# Patient Record
Sex: Female | Born: 1995 | Race: White | Hispanic: No | Marital: Single | Smoking: Current every day smoker
Health system: Southern US, Community
[De-identification: ages and names within clinical notes are randomized; demographics above are authoritative.]

## PROBLEM LIST (undated history)

## (undated) DIAGNOSIS — K589 Irritable bowel syndrome without diarrhea: Secondary | ICD-10-CM

## (undated) DIAGNOSIS — E739 Lactose intolerance, unspecified: Secondary | ICD-10-CM

## (undated) DIAGNOSIS — N926 Irregular menstruation, unspecified: Secondary | ICD-10-CM

## (undated) DIAGNOSIS — F909 Attention-deficit hyperactivity disorder, unspecified type: Secondary | ICD-10-CM

## (undated) DIAGNOSIS — Z309 Encounter for contraceptive management, unspecified: Secondary | ICD-10-CM

## (undated) HISTORY — DX: Attention-deficit hyperactivity disorder, unspecified type: F90.9

## (undated) HISTORY — DX: Lactose intolerance, unspecified: E73.9

## (undated) HISTORY — DX: Encounter for contraceptive management, unspecified: Z30.9

## (undated) HISTORY — PX: NO PAST SURGERIES: SHX2092

## (undated) HISTORY — DX: Irregular menstruation, unspecified: N92.6

## (undated) HISTORY — DX: Irritable bowel syndrome, unspecified: K58.9

---
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
## 2015-12-06 ENCOUNTER — Encounter: Payer: Self-pay | Admitting: Adult Health

## 2015-12-06 ENCOUNTER — Ambulatory Visit (INDEPENDENT_AMBULATORY_CARE_PROVIDER_SITE_OTHER): Payer: BLUE CROSS/BLUE SHIELD | Admitting: Adult Health

## 2015-12-06 VITALS — BP 120/80 | HR 84 | Ht 60.25 in | Wt 154.0 lb

## 2015-12-06 DIAGNOSIS — Z30011 Encounter for initial prescription of contraceptive pills: Secondary | ICD-10-CM

## 2015-12-06 DIAGNOSIS — Z3202 Encounter for pregnancy test, result negative: Secondary | ICD-10-CM | POA: Diagnosis not present

## 2015-12-06 DIAGNOSIS — Z113 Encounter for screening for infections with a predominantly sexual mode of transmission: Secondary | ICD-10-CM | POA: Diagnosis not present

## 2015-12-06 DIAGNOSIS — N926 Irregular menstruation, unspecified: Secondary | ICD-10-CM

## 2015-12-06 LAB — POCT URINE PREGNANCY: PREG TEST UR: NEGATIVE

## 2015-12-06 MED ORDER — NORGESTIMATE-ETH ESTRADIOL 0.25-35 MG-MCG PO TABS: 1.0000 | ORAL_TABLET | Freq: Every day | ORAL | 11 refills | Status: DC

## 2015-12-06 NOTE — Patient Instructions (Addendum)
Start OCs Sunday Use condoms Follow up prn

## 2015-12-06 NOTE — Progress Notes (Signed)
Subjective:     Patient ID: Dawn Huff, female   DOB: Nov 10, 1995, 20 y.o.   MRN: [id number]  HPI [place] is a 20 year old white female in requesting STD screening and wants to get back on birth control, she is having irregular periods and has been off birth control pills over 2 months.   Review of Systems Patient denies any headaches, hearing loss, fatigue, blurred vision, shortness of breath, chest pain, abdominal pain, problems with bowel movements, urination, or intercourse. No joint pain or mood swings. +irregular periods    Reviewed past medical,surgical, social and family history. Reviewed medications and allergies.  Objective:   Physical Exam BP 120/80 (BP Location: Left Arm, Patient Position: Sitting, Cuff Size: Normal)   Pulse 84   Ht 5' 0.25" (1.53 m)   Wt 154 lb (69.9 kg)   LMP 11/05/2015 (Approximate)   BMI 29.83 kg/m UPT negative, Skin warm and dry.Pelvic: external genitalia is normal in appearance no lesions,vagina has period like blood present,urethra has no lesions or masses noted, cervix:smooth, uterus: normal size, shape and contour, non tender, no masses felt, adnexa: no masses or tenderness noted. Bladder is non tender and no masses felt.  GC/CHL obtained.     Assessment:     1. Encounter for initial prescription of contraceptive pills   2. Screening for STD (sexually transmitted disease)   3. Irregular periods   4. Pregnancy examination or test, negative result       Plan:    GC/CHL sent Check HIV,RPR and HSV 2 Rx sprintec, disp 1 pack take 1 daily with 11 refills  Use condoms  Follow up prn

## 2015-12-07 LAB — HSV 2 ANTIBODY, IGG: HSV 2 Glycoprotein G Ab, IgG: <0.91 index (ref 0.00–0.90)

## 2015-12-07 LAB — HIV ANTIBODY (ROUTINE TESTING W REFLEX): HIV Screen 4th Generation wRfx: NONREACTIVE

## 2015-12-07 LAB — RPR: RPR Ser Ql: NONREACTIVE

## 2015-12-10 LAB — GC/CHLAMYDIA PROBE AMP
Chlamydia trachomatis, NAA: NEGATIVE
Neisseria gonorrhoeae by PCR: NEGATIVE

## 2015-12-27 ENCOUNTER — Telehealth: Payer: Self-pay | Admitting: *Deleted

## 2015-12-27 NOTE — Telephone Encounter (Signed)
Pt states has unprotected sex a 4 days ago, c/o bumps on vagina. Pt given an appt with Cyril MourningJennifer Griffin, NP for Monday.

## 2015-12-30 ENCOUNTER — Encounter: Payer: Self-pay | Admitting: *Deleted

## 2015-12-30 ENCOUNTER — Ambulatory Visit: Payer: Self-pay | Admitting: Adult Health

## 2020-06-07 ENCOUNTER — Emergency Department (HOSPITAL_COMMUNITY)
Admission: EM | Admit: 2020-06-07 | Discharge: 2020-06-07 | Disposition: A | Discharge status: Home / Self Care | Payer: Medicaid Other | Attending: Emergency Medicine | Admitting: Emergency Medicine

## 2020-06-07 ENCOUNTER — Other Ambulatory Visit: Payer: Self-pay

## 2020-06-07 ENCOUNTER — Encounter (HOSPITAL_COMMUNITY): Payer: Self-pay | Admitting: *Deleted

## 2020-06-07 DIAGNOSIS — Z5321 Procedure and treatment not carried out due to patient leaving prior to being seen by health care provider: Secondary | ICD-10-CM | POA: Diagnosis not present

## 2020-06-07 DIAGNOSIS — Z7689 Persons encountering health services in other specified circumstances: Secondary | ICD-10-CM | POA: Diagnosis not present

## 2020-06-07 NOTE — ED Notes (Signed)
Not in triage area when called

## 2020-06-07 NOTE — ED Triage Notes (Signed)
Called , not in waiting area

## 2020-06-07 NOTE — ED Triage Notes (Signed)
States she needs proof of pregnancy to get her medicaid

## 2020-06-12 ENCOUNTER — Emergency Department (HOSPITAL_COMMUNITY)
Admission: EM | Admit: 2020-06-12 | Discharge: 2020-06-12 | Disposition: A | Discharge status: Home / Self Care | Payer: Medicaid Other | Attending: Emergency Medicine | Admitting: Emergency Medicine

## 2020-06-12 ENCOUNTER — Emergency Department (HOSPITAL_COMMUNITY): Payer: Medicaid Other

## 2020-06-12 ENCOUNTER — Other Ambulatory Visit: Payer: Self-pay

## 2020-06-12 ENCOUNTER — Encounter (HOSPITAL_COMMUNITY): Payer: Self-pay

## 2020-06-12 DIAGNOSIS — O26891 Other specified pregnancy related conditions, first trimester: Secondary | ICD-10-CM | POA: Insufficient documentation

## 2020-06-12 DIAGNOSIS — Z3A01 Less than 8 weeks gestation of pregnancy: Secondary | ICD-10-CM | POA: Diagnosis not present

## 2020-06-12 DIAGNOSIS — R102 Pelvic and perineal pain: Secondary | ICD-10-CM

## 2020-06-12 DIAGNOSIS — R55 Syncope and collapse: Secondary | ICD-10-CM | POA: Diagnosis not present

## 2020-06-12 DIAGNOSIS — R42 Dizziness and giddiness: Secondary | ICD-10-CM | POA: Diagnosis not present

## 2020-06-12 DIAGNOSIS — H538 Other visual disturbances: Secondary | ICD-10-CM | POA: Diagnosis not present

## 2020-06-12 DIAGNOSIS — O26811 Pregnancy related exhaustion and fatigue, first trimester: Secondary | ICD-10-CM | POA: Diagnosis not present

## 2020-06-12 DIAGNOSIS — F17210 Nicotine dependence, cigarettes, uncomplicated: Secondary | ICD-10-CM | POA: Insufficient documentation

## 2020-06-12 LAB — CBC WITH DIFFERENTIAL/PLATELET
Abs Immature Granulocytes: 0.05 10*3/uL (ref 0.00–0.07)
Basophils Absolute: 0.1 10*3/uL (ref 0.0–0.1)
Basophils Relative: 1 %
Eosinophils Absolute: 0.3 10*3/uL (ref 0.0–0.5)
Eosinophils Relative: 2 %
HCT: 40.6 % (ref 36.0–46.0)
Hemoglobin: 13.5 g/dL (ref 12.0–15.0)
Immature Granulocytes: 1 %
Lymphocytes Relative: 29 %
Lymphs Abs: 3.2 10*3/uL (ref 0.7–4.0)
MCH: 31.4 pg (ref 26.0–34.0)
MCHC: 33.3 g/dL (ref 30.0–36.0)
MCV: 94.4 fL (ref 80.0–100.0)
Monocytes Absolute: 0.6 10*3/uL (ref 0.1–1.0)
Monocytes Relative: 6 %
Neutro Abs: 6.9 10*3/uL (ref 1.7–7.7)
Neutrophils Relative %: 61 %
Platelets: 336 10*3/uL (ref 150–400)
RBC: 4.3 MIL/uL (ref 3.87–5.11)
RDW: 13 % (ref 11.5–15.5)
WBC: 11.1 10*3/uL — ABNORMAL HIGH (ref 4.0–10.5)
nRBC: 0 % (ref 0.0–0.2)

## 2020-06-12 LAB — BASIC METABOLIC PANEL
Anion gap: 7 (ref 5–15)
BUN: 17 mg/dL (ref 6–20)
CO2: 25 mmol/L (ref 22–32)
Calcium: 8.6 mg/dL — ABNORMAL LOW (ref 8.9–10.3)
Chloride: 105 mmol/L (ref 98–111)
Creatinine, Ser: 0.6 mg/dL (ref 0.44–1.00)
GFR, Estimated: >60 mL/min (ref >60)
Glucose, Bld: 106 mg/dL — ABNORMAL HIGH (ref 70–99)
Potassium: 3.4 mmol/L — ABNORMAL LOW (ref 3.5–5.1)
Sodium: 137 mmol/L (ref 135–145)

## 2020-06-12 LAB — URINALYSIS, ROUTINE W REFLEX MICROSCOPIC
Bacteria, UA: NONE SEEN
Bilirubin Urine: NEGATIVE
Glucose, UA: NEGATIVE mg/dL
Hgb urine dipstick: NEGATIVE
Ketones, ur: 5 mg/dL — AB
Nitrite: NEGATIVE
Protein, ur: 100 mg/dL — AB
Specific Gravity, Urine: 1.026 (ref 1.005–1.030)
pH: 5 (ref 5.0–8.0)

## 2020-06-12 LAB — HCG, QUANTITATIVE, PREGNANCY: hCG, Beta Chain, Quant, S: 8827 m[IU]/mL — ABNORMAL HIGH (ref <5)

## 2020-06-12 IMAGING — US US OB < 14 WEEKS - US OB TV
1 series · 14 of 28 positions shown · non-contrast
Comparison: None.

CLINICAL DATA: Left lower quadrant abdominal pain, first trimester
of pregnancy.

EXAM:
OBSTETRIC <14 WK US AND TRANSVAGINAL OB US
TECHNIQUE: Both transabdominal and transvaginal ultrasound examinations were
performed for complete evaluation of the gestation as well as the
maternal uterus, adnexal regions, and pelvic cul-de-sac.
Transvaginal technique was performed to assess early pregnancy.

[Series 1: us ob < 14 weeks - us ob tv · 0.20mm/px · 54 acquisitions, 14 frames shown]
[im 2/54]
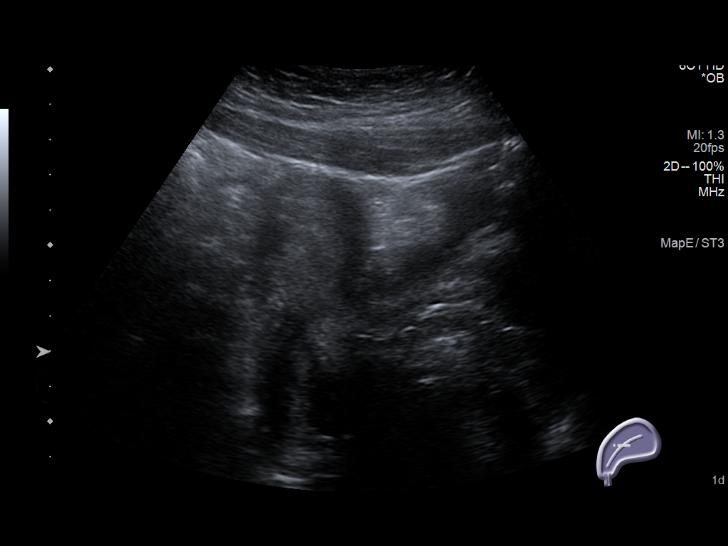
[im 6/54]
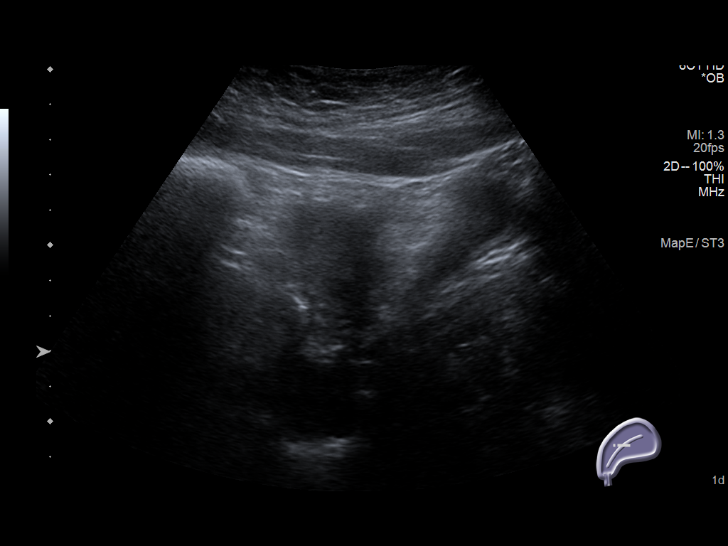
[im 10/54]
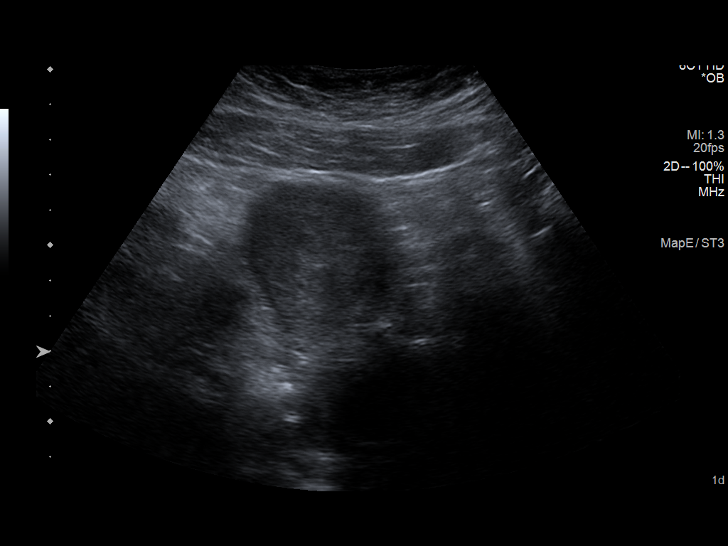
[im 14/54]
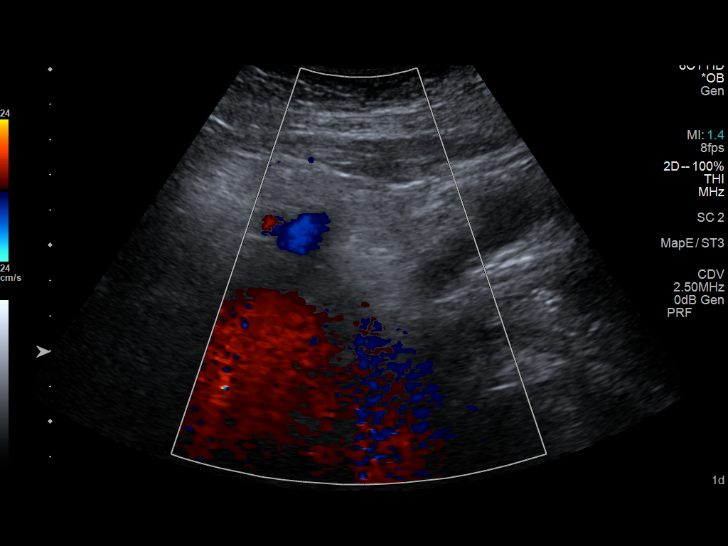
[im 18/54]
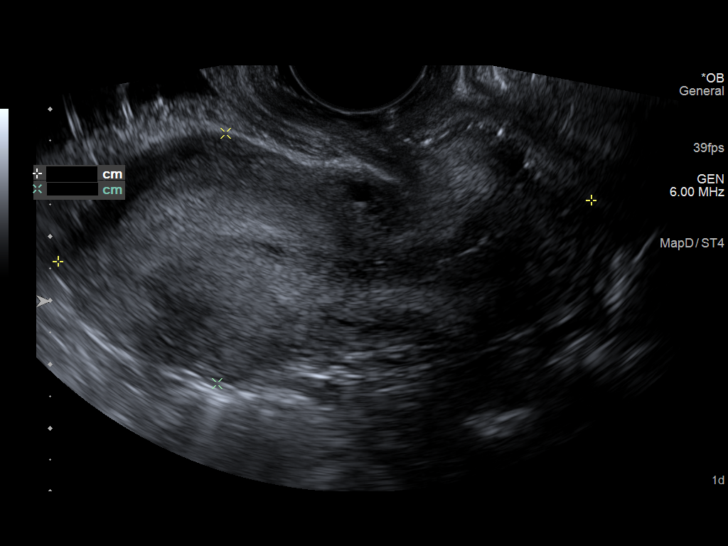
[im 22/54]
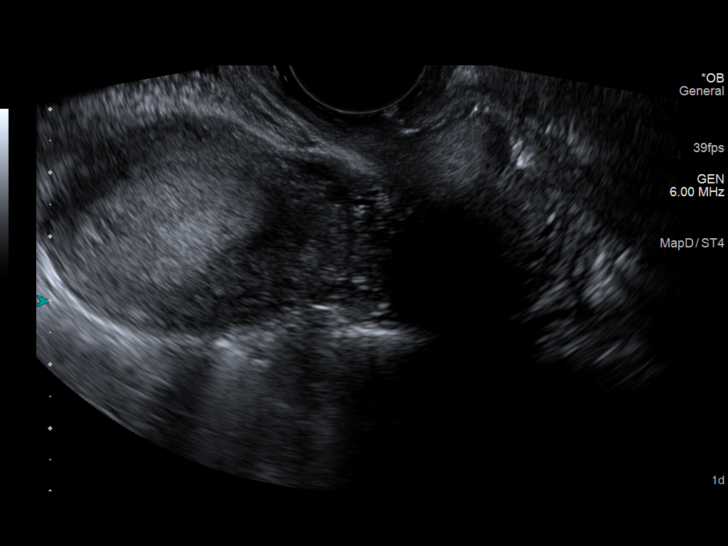
[im 26/54]
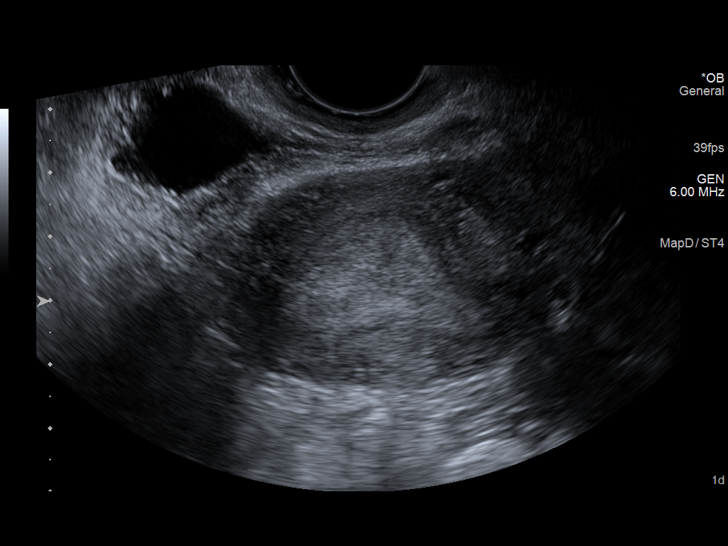
[im 30/54]
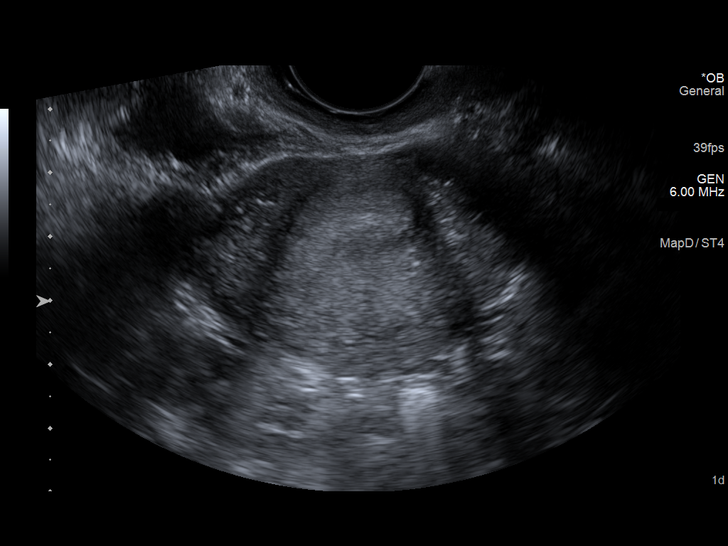
[im 34/54]
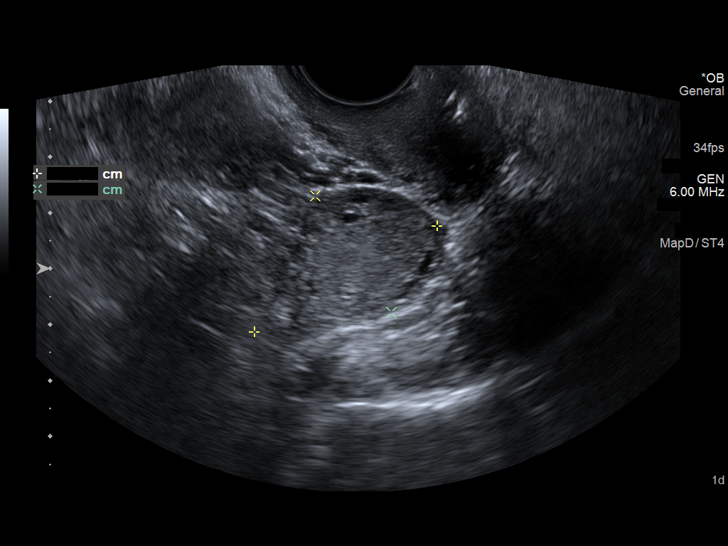
[im 38/54]
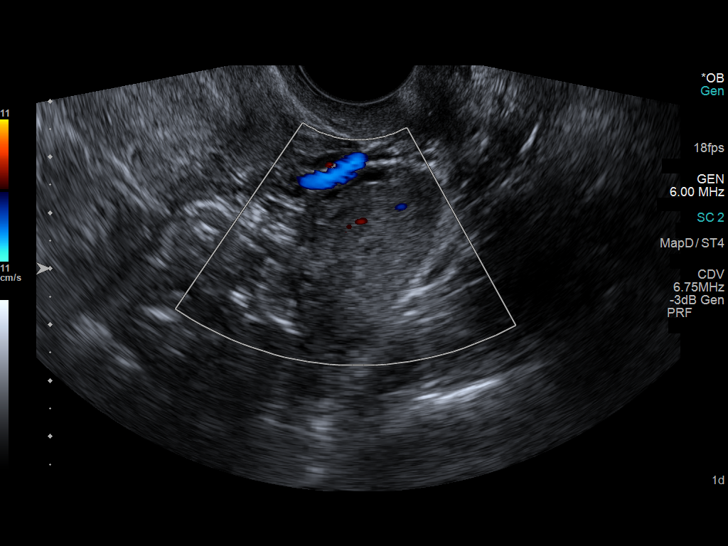
[im 42/54]
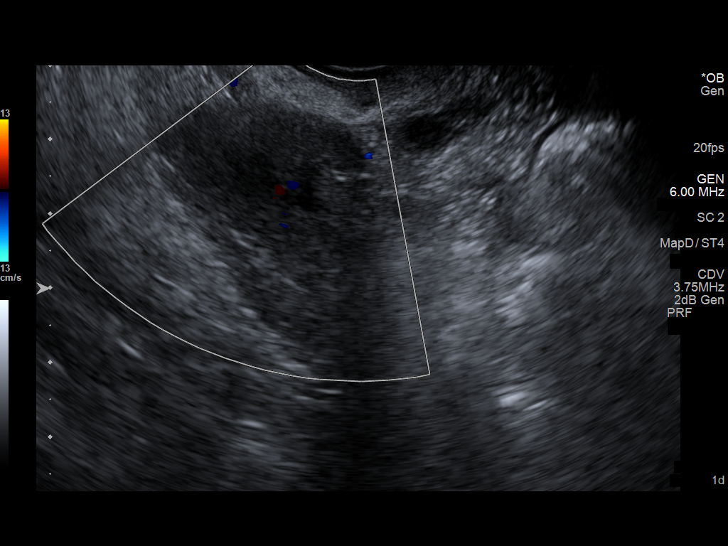
[im 46/54]
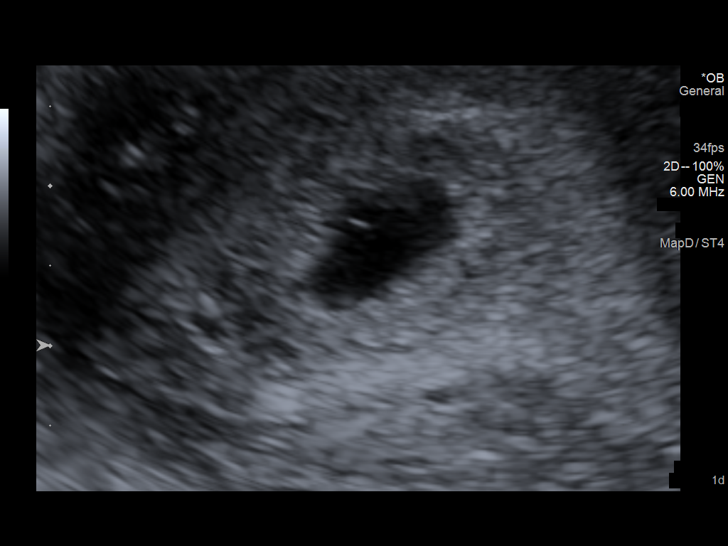
[im 50/54]
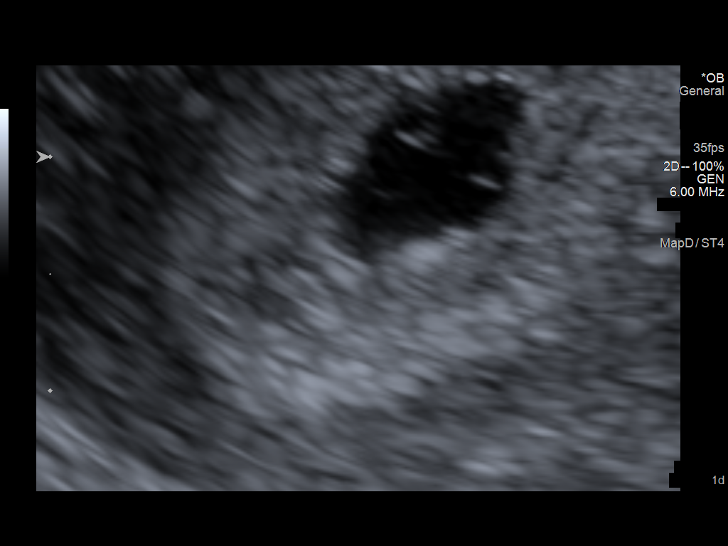
[im 54/54]
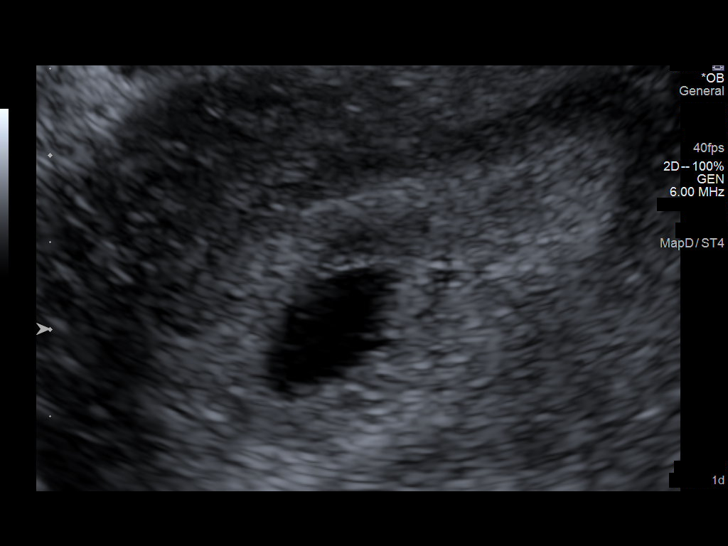

[14 of 28 positions shown; findings below may reference images not displayed]

FINDINGS: Intrauterine gestational sac: Single

Yolk sac:  Visualized.

Embryo:  Not Visualized.

Cardiac Activity: Not Visualized.

MSD: 8.8 mm   5 w   5 d

Subchorionic hemorrhage:  None visualized.

Maternal uterus/adnexae: Ovaries are unremarkable. No free fluid is
noted.
IMPRESSION: Probable early intrauterine gestational sac with yolk sac, but no
fetal pole or cardiac activity yet visualized. Recommend follow-up
quantitative B-HCG levels and follow-up US in 14 days to assess
viability. This recommendation follows SRU consensus guidelines:
Diagnostic Criteria for Nonviable Pregnancy Early in the First
Trimester. N Engl J Med [NS]; [DATE].

## 2020-06-12 MED ORDER — SODIUM CHLORIDE 0.9 % IV BOLUS
1000.0000 mL | Freq: Once | INTRAVENOUS | Status: AC
  Administered 2020-06-12: 1000 mL via INTRAVENOUS

## 2020-06-12 MED ORDER — POTASSIUM CHLORIDE CRYS ER 20 MEQ PO TBCR
40.0000 meq | EXTENDED_RELEASE_TABLET | Freq: Once | ORAL | Status: AC
  Administered 2020-06-12: 40 meq via ORAL
  Filled 2020-06-12: qty 2

## 2020-06-12 NOTE — Discharge Instructions (Addendum)
Call your primary care doctor or specialist as discussed in the next 2-3 days.   Return immediately back to the ER if:  Your symptoms worsen within the next 12-24 hours. You develop new symptoms such as new fevers, persistent vomiting, new pain, shortness of breath, or new weakness or numbness, or if you have any other concerns.  

## 2020-06-12 NOTE — ED Notes (Signed)
Patient transported to Ultrasound 

## 2020-06-12 NOTE — ED Triage Notes (Signed)
Pt presents to ED with complaints of LOC. Pt states prior to LOC was having R knee pain, went to stand became dizzy, vision became blurry and she blacked out, father then lowered her to the ground. Pt currently [redacted] weeks pregnant. Pt has first OB appt on 30th at [facility].

## 2020-06-12 NOTE — ED Provider Notes (Signed)
[facility] EMERGENCY DEPARTMENT Provider Note   CSN: [id number] Arrival date & time: 06/12/20  0932     History Chief Complaint  Patient presents with  . Loss of Consciousness    Dawn Huff is a 25 y.o. female.  Patient presents ER chief complaint of a syncopal episode.  Patient states she had been sitting for some time today and then when she stood up she got lightheaded vision go dark and the next thing she knew she was waking up on the ground.  She states that her father was nearby and it helped her to the ground.  She denies any headache or chest pain.  She states she is had some right knee pain for the past week and has been having some left lower quadrant pain today.  Describes the pain in the abdomen as mild to moderate and achy.        Past Medical History:  Diagnosis Date  . ADHD (attention deficit hyperactivity disorder)   . Contraceptive management 03/13/2013  . IBS (irritable bowel syndrome)   . Irregular bleeding 03/10/2013  . Lactose intolerance     Patient Active Problem List   Diagnosis Date Noted  . Contraceptive management 03/13/2013  . Irregular bleeding 03/10/2013    History reviewed. No pertinent surgical history.   OB History    Gravida  1   Para  0   Term  0   Preterm  0   AB  0   Living  0     SAB  0   IAB  0   Ectopic  0   Multiple  0   Live Births              Family History  Problem Relation Age of Onset  . Hypertension Maternal Grandmother   . Diabetes Maternal Grandmother     Social History   Tobacco Use  . Smoking status: Current Every Day Smoker    Packs/day: 1.00    Years: 6.00    Pack years: 6.00    Types: Cigarettes  . Smokeless tobacco: Never Used  Substance Use Topics  . Alcohol use: Yes    Comment: occ  . Drug use: Yes    Types: Marijuana    Comment: twice daily    Home Medications Prior to Admission medications   Medication Sig Start Date End Date Taking? Authorizing Provider   lisdexamfetamine (VYVANSE) 50 MG capsule Take 50 mg by mouth every evening.    [provider]  lisdexamfetamine (VYVANSE) 70 MG capsule Take 70 mg by mouth every morning.    [provider]  norgestimate-ethinyl estradiol (ORTHO-CYCLEN,SPRINTEC,PREVIFEM) 0.25-35 MG-MCG tablet Take 1 tablet by mouth daily. 12/06/15   Adline Potter, NP    Allergies    Patient has no known allergies.  Review of Systems   Review of Systems  Constitutional: Negative for fever.  HENT: Negative for ear pain.   Eyes: Negative for pain.  Respiratory: Negative for cough.   Cardiovascular: Negative for chest pain.  Gastrointestinal: Positive for abdominal pain.  Genitourinary: Negative for flank pain.  Musculoskeletal: Negative for back pain.  Skin: Negative for rash.  Neurological: Negative for headaches.    Physical Exam Updated Vital Signs BP 122/79   Pulse 89   Temp 98.4 F (36.9 C) (Oral)   Resp 20   Ht 5' (1.524 m)   Wt 65.8 kg   LMP 04/16/2020 (Approximate)   SpO2 98%   BMI 28.32 kg/m  Physical Exam Constitutional:      General: She is not in acute distress.    Appearance: Normal appearance.  HENT:     Head: Normocephalic.     Nose: Nose normal.  Eyes:     Extraocular Movements: Extraocular movements intact.  Cardiovascular:     Rate and Rhythm: Normal rate.  Pulmonary:     Effort: Pulmonary effort is normal.  Abdominal:     Comments: No abdominal tenderness or palpation on exam noted.  No guarding or rebound noted.  Musculoskeletal:        General: Normal range of motion.     Cervical back: Normal range of motion.     Comments: OhEvaluation of the right knee shows no visual deformity or swelling or erythema noted.  Kniffen tenderness on palpation.  Mild discomfort present but appears nonfocal.  She is able to extend and flex her knee near full range.  She is able to weight-bear with only mild discomfort.    Neurological:     General: No focal deficit  present.     Mental Status: She is alert. Mental status is at baseline.     ED Results / Procedures / Treatments   Labs (all labs ordered are listed, but only abnormal results are displayed) Labs Reviewed  BASIC METABOLIC PANEL - Abnormal; Notable for the following components:      Result Value   Potassium 3.4 (*)    Glucose, Bld 106 (*)    Calcium 8.6 (*)    All other components within normal limits  CBC WITH DIFFERENTIAL/PLATELET - Abnormal; Notable for the following components:   WBC 11.1 (*)    All other components within normal limits  URINALYSIS, ROUTINE W REFLEX MICROSCOPIC - Abnormal; Notable for the following components:   Color, Urine AMBER (*)    APPearance CLOUDY (*)    Ketones, ur 5 (*)    Protein, ur 100 (*)    Leukocytes,Ua SMALL (*)    All other components within normal limits  HCG, QUANTITATIVE, PREGNANCY - Abnormal; Notable for the following components:   hCG, Beta Chain, Quant, S 8,827 (*)    All other components within normal limits    EKG None  Radiology US OB LESS THAN 14 WEEKS WITH OB TRANSVAGINAL  Result Date: 06/12/2020 CLINICAL DATA:  Left lower quadrant abdominal pain, first trimester of pregnancy. EXAM: OBSTETRIC <14 WK [place] AND TRANSVAGINAL OB US TECHNIQUE: Both transabdominal and transvaginal ultrasound examinations were performed for complete evaluation of the gestation as well as the maternal uterus, adnexal regions, and pelvic cul-de-sac. Transvaginal technique was performed to assess early pregnancy. COMPARISON:  None. FINDINGS: Intrauterine gestational sac: Single Yolk sac:  Visualized. Embryo:  Not Visualized. Cardiac Activity: Not Visualized. MSD: 8.8 mm   5 w   5 d Subchorionic hemorrhage:  None visualized. Maternal uterus/adnexae: Ovaries are unremarkable. No free fluid is noted. IMPRESSION: Probable early intrauterine gestational sac with yolk sac, but no fetal pole or cardiac activity yet visualized. Recommend follow-up quantitative B-HCG  levels and follow-up US in 14 days to assess viability. This recommendation follows SRU consensus guidelines: Diagnostic Criteria for Nonviable Pregnancy Early in the First Trimester. Malva Limes Med 2013; 527:7824-23. Electronically Signed   By: Lupita Raider M.D.   On: 06/12/2020 11:20    Procedures Procedures   Medications Ordered in ED Medications  potassium chloride SA (KLOR-CON) CR tablet 40 mEq (has no administration in time range)  sodium chloride 0.9 % bolus 1,000  mL (1,000 mLs Intravenous New Bag/Given 06/12/20 1015)    ED Course  I have reviewed the triage vital signs and the nursing notes.  Pertinent labs & imaging results that were available during my care of the patient were reviewed by me and considered in my medical decision making (see chart for details).    MDM Rules/Calculators/A&P                          Labs otherwise unremarkable white count 11 hemoglobin 13 chemistry shows mild hypokalemia.  She was given KCl orally here.  Beta-hCG is 8800, urinalysis shows no bacteria no evidence of UTI.  Ultrasound shows positive IUP with yolk sac and gestational sac identified.  Advised her to follow-up with her OB/GYN doctor within 3 to 4 days.  Advising immediate return for bleeding pain worsening symptoms trouble breathing or any additional concerns.  Final Clinical Impression(s) / ED Diagnoses Final diagnoses:  Syncope, unspecified syncope type    Rx / DC Orders ED Discharge Orders    None       Cheryll Cockayne, MD 06/12/20 1138

## 2020-06-25 ENCOUNTER — Other Ambulatory Visit: Payer: Self-pay | Admitting: Obstetrics & Gynecology

## 2020-06-25 DIAGNOSIS — O3680X0 Pregnancy with inconclusive fetal viability, not applicable or unspecified: Secondary | ICD-10-CM

## 2020-06-26 ENCOUNTER — Other Ambulatory Visit: Payer: Self-pay

## 2020-06-26 ENCOUNTER — Ambulatory Visit (INDEPENDENT_AMBULATORY_CARE_PROVIDER_SITE_OTHER): Payer: Medicaid Other

## 2020-06-26 DIAGNOSIS — O3680X0 Pregnancy with inconclusive fetal viability, not applicable or unspecified: Secondary | ICD-10-CM | POA: Diagnosis not present

## 2020-06-26 NOTE — Progress Notes (Signed)
[place] 7+1 wks,single IUP w/ys,positive fht 143 bpm,CRL 10.50 mm,normal ovaries

## 2020-07-17 ENCOUNTER — Telehealth: Payer: Self-pay

## 2020-07-17 NOTE — Telephone Encounter (Signed)
Pt needing a Rx for subcutex until her new OB appt. On 08/05/2020. She stated she went to a facility for treatment but due to other medical conditions they wouldn't treat her

## 2020-07-18 NOTE — Telephone Encounter (Signed)
Pt called to check status, can we order this?  States they're trying to ship her off to a facility   [facility] in [place]

## 2020-07-18 NOTE — Telephone Encounter (Signed)
Second attempt made to reach patient.  VM not set up.

## 2020-07-18 NOTE — Telephone Encounter (Signed)
Returned patient's call. VM not set up. 

## 2020-07-19 ENCOUNTER — Encounter: Payer: Self-pay | Admitting: *Deleted

## 2020-07-19 NOTE — Telephone Encounter (Signed)
Patient informed I have set her up an appt in [place] for a provider to prescribe Subutex. All future appts will be with them.  Pt verbalized understanding.

## 2020-07-30 ENCOUNTER — Other Ambulatory Visit: Payer: Self-pay

## 2020-07-30 ENCOUNTER — Ambulatory Visit (INDEPENDENT_AMBULATORY_CARE_PROVIDER_SITE_OTHER): Payer: Medicaid Other | Admitting: Family Medicine

## 2020-07-30 ENCOUNTER — Other Ambulatory Visit (HOSPITAL_COMMUNITY)
Admission: RE | Admit: 2020-07-30 | Discharge: 2020-07-30 | Disposition: A | Discharge status: Home / Self Care | Payer: Medicaid Other | Source: Ambulatory Visit | Attending: Family Medicine | Admitting: Family Medicine

## 2020-07-30 ENCOUNTER — Encounter: Payer: Self-pay | Admitting: Family Medicine

## 2020-07-30 DIAGNOSIS — O0990 Supervision of high risk pregnancy, unspecified, unspecified trimester: Secondary | ICD-10-CM

## 2020-07-30 DIAGNOSIS — F1190 Opioid use, unspecified, uncomplicated: Secondary | ICD-10-CM | POA: Insufficient documentation

## 2020-07-30 LAB — POCT URINALYSIS DIP (DEVICE)
Bilirubin Urine: NEGATIVE
Glucose, UA: NEGATIVE mg/dL
Hgb urine dipstick: NEGATIVE
Ketones, ur: NEGATIVE mg/dL
Nitrite: NEGATIVE
Protein, ur: NEGATIVE mg/dL
Specific Gravity, Urine: 1.015 (ref 1.005–1.030)
Urobilinogen, UA: 0.2 mg/dL (ref 0.0–1.0)
pH: 7.5 (ref 5.0–8.0)

## 2020-07-30 MED ORDER — BUPRENORPHINE HCL-NALOXONE HCL 2-0.5 MG SL FILM: ORAL_FILM | SUBLINGUAL | 0 refills | Status: DC

## 2020-07-30 MED ORDER — BLOOD PRESSURE KIT DEVI: 1.0000 | 0 refills | Status: DC | PRN

## 2020-07-30 MED ORDER — PRENATAL VITAMIN 27-0.8 MG PO TABS: 1.0000 | ORAL_TABLET | Freq: Every day | ORAL | 12 refills | Status: DC

## 2020-07-30 NOTE — Patient Instructions (Signed)
 Second Trimester of Pregnancy  The second trimester of pregnancy is from week 13 through week 27. This is months 4 through 6 of pregnancy. The second trimester is often a time when you feel your best. Your body has adjusted to being pregnant, and you begin to feel better physically. During the second trimester:  Morning sickness has lessened or stopped completely.  You may have more energy.  You may have an increase in appetite. The second trimester is also a time when the unborn baby (fetus) is growing rapidly. At the end of the sixth month, the fetus may be up to 12 inches long and weigh about 1 pounds. You will likely begin to feel the baby move (quickening) between 16 and 20 weeks of pregnancy. Body changes during your second trimester Your body continues to go through many changes during your second trimester. The changes vary and generally return to normal after the baby is born. Physical changes  Your weight will continue to increase. You will notice your lower abdomen bulging out.  You may begin to get stretch marks on your hips, abdomen, and breasts.  Your breasts will continue to grow and to become tender.  Dark spots or blotches (chloasma or mask of pregnancy) may develop on your face.  A dark line from your belly button to the pubic area (linea nigra) may appear.  You may have changes in your hair. These can include thickening of your hair, rapid growth, and changes in texture. Some people also have hair loss during or after pregnancy, or hair that feels dry or thin. Health changes  You may develop headaches.  You may have heartburn.  You may develop constipation.  You may develop hemorrhoids or swollen, bulging veins (varicose veins).  Your gums may bleed and may be sensitive to brushing and flossing.  You may urinate more often because the fetus is pressing on your bladder.  You may have back pain. This is caused by: ? Weight gain. ? Pregnancy hormones  that are relaxing the joints in your pelvis. ? A shift in weight and the muscles that support your balance. Follow these instructions at home: Medicines  Follow your health care provider's instructions regarding medicine use. Specific medicines may be either safe or unsafe to take during pregnancy. Do not take any medicines unless approved by your health care provider.  Take a prenatal vitamin that contains at least 600 micrograms (mcg) of folic acid. Eating and drinking  Eat a healthy diet that includes fresh fruits and vegetables, whole grains, good sources of protein such as meat, eggs, or tofu, and low-fat dairy products.  Avoid raw meat and unpasteurized juice, milk, and cheese. These carry germs that can harm you and your baby.  You may need to take these actions to prevent or treat constipation: ? Drink enough fluid to keep your urine pale yellow. ? Eat foods that are high in fiber, such as beans, whole grains, and fresh fruits and vegetables. ? Limit foods that are high in fat and processed sugars, such as fried or sweet foods. Activity  Exercise only as directed by your health care provider. Most people can continue their usual exercise routine during pregnancy. Try to exercise for 30 minutes at least 5 days a week. Stop exercising if you develop contractions in your uterus.  Stop exercising if you develop pain or cramping in the lower abdomen or lower back.  Avoid exercising if it is very hot or humid or if you are   at a high altitude.  Avoid heavy lifting.  If you choose to, you may have sex unless your health care provider tells you not to. Relieving pain and discomfort  Wear a supportive bra to prevent discomfort from breast tenderness.  Take warm sitz baths to soothe any pain or discomfort caused by hemorrhoids. Use hemorrhoid cream if your health care provider approves.  Rest with your legs raised (elevated) if you have leg cramps or low back pain.  If you develop  varicose veins: ? Wear support hose as told by your health care provider. ? Elevate your feet for 15 minutes, 3-4 times a day. ? Limit salt in your diet. Safety  Wear your seat belt at all times when driving or riding in a car.  Talk with your health care provider if someone is verbally or physically abusive to you. Lifestyle  Do not use hot tubs, steam rooms, or saunas.  Do not douche. Do not use tampons or scented sanitary pads.  Avoid cat litter boxes and soil used by cats. These carry germs that can cause birth defects in the baby and possibly loss of the fetus by miscarriage or stillbirth.  Do not use herbal remedies, alcohol, illegal drugs, or medicines that are not approved by your health care provider. Chemicals in these products can harm your baby.  Do not use any products that contain nicotine or tobacco, such as cigarettes, e-cigarettes, and chewing tobacco. If you need help quitting, ask your health care provider. General instructions  During a routine prenatal visit, your health care provider will do a physical exam and other tests. He or she will also discuss your overall health. Keep all follow-up visits. This is important.  Ask your health care provider for a referral to a local prenatal education class.  Ask for help if you have counseling or nutritional needs during pregnancy. Your health care provider can offer advice or refer you to specialists for help with various needs. Where to find more information  American Pregnancy Association: americanpregnancy.org  American College of Obstetricians and Gynecologists: acog.org/en/Womens%20Health/Pregnancy  Office on Women's Health: womenshealth.gov/pregnancy Contact a health care provider if you have:  A headache that does not go away when you take medicine.  Vision changes or you see spots in front of your eyes.  Mild pelvic cramps, pelvic pressure, or nagging pain in the abdominal area.  Persistent nausea,  vomiting, or diarrhea.  A bad-smelling vaginal discharge or foul-smelling urine.  Pain when you urinate.  Sudden or extreme swelling of your face, hands, ankles, feet, or legs.  A fever. Get help right away if you:  Have fluid leaking from your vagina.  Have spotting or bleeding from your vagina.  Have severe abdominal cramping or pain.  Have difficulty breathing.  Have chest pain.  Have fainting spells.  Have not felt your baby move for the time period told by your health care provider.  Have new or increased pain, swelling, or redness in an arm or leg. Summary  The second trimester of pregnancy is from week 13 through week 27 (months 4 through 6).  Do not use herbal remedies, alcohol, illegal drugs, or medicines that are not approved by your health care provider. Chemicals in these products can harm your baby.  Exercise only as directed by your health care provider. Most people can continue their usual exercise routine during pregnancy.  Keep all follow-up visits. This is important. This information is not intended to replace advice given to you by   your health care provider. Make sure you discuss any questions you have with your health care provider. Document Revised: 08/23/2019 Document Reviewed: 06/29/2019 Elsevier Patient Education  2021 [employer]   Contraception Choices Contraception, also called birth control, refers to methods or devices that prevent pregnancy. Hormonal methods Contraceptive implant A contraceptive implant is a thin, plastic tube that contains a hormone that prevents pregnancy. It is different from an intrauterine device (IUD). It is inserted into the upper part of the arm by a health care provider. Implants can be effective for up to 3 years. Progestin-only injections Progestin-only injections are injections of progestin, a synthetic form of the hormone progesterone. They are given every 3 months by a health care provider. Birth control  pills Birth control pills are pills that contain hormones that prevent pregnancy. They must be taken once a day, preferably at the same time each day. A prescription is needed to use this method of contraception. Birth control patch The birth control patch contains hormones that prevent pregnancy. It is placed on the skin and must be changed once a week for three weeks and removed on the fourth week. A prescription is needed to use this method of contraception. Vaginal ring A vaginal ring contains hormones that prevent pregnancy. It is placed in the vagina for three weeks and removed on the fourth week. After that, the process is repeated with a new ring. A prescription is needed to use this method of contraception. Emergency contraceptive Emergency contraceptives prevent pregnancy after unprotected sex. They come in pill form and can be taken up to 5 days after sex. They work best the sooner they are taken after having sex. Most emergency contraceptives are available without a prescription. This method should not be used as your only form of birth control.   Barrier methods Female condom A female condom is a thin sheath that is worn over the penis during sex. Condoms keep sperm from going inside a woman's body. They can be used with a sperm-killing substance (spermicide) to increase their effectiveness. They should be thrown away after one use. Female condom A female condom is a soft, loose-fitting sheath that is put into the vagina before sex. The condom keeps sperm from going inside a woman's body. They should be thrown away after one use. Diaphragm A diaphragm is a soft, dome-shaped barrier. It is inserted into the vagina before sex, along with a spermicide. The diaphragm blocks sperm from entering the uterus, and the spermicide kills sperm. A diaphragm should be left in the vagina for 6-8 hours after sex and removed within 24 hours. A diaphragm is prescribed and fitted by a health care provider. A  diaphragm should be replaced every 1-2 years, after giving birth, after gaining more than 15 lb (6.8 kg), and after pelvic surgery. Cervical cap A cervical cap is a round, soft latex or plastic cup that fits over the cervix. It is inserted into the vagina before sex, along with spermicide. It blocks sperm from entering the uterus. The cap should be left in place for 6-8 hours after sex and removed within 48 hours. A cervical cap must be prescribed and fitted by a health care provider. It should be replaced every 2 years. Sponge A sponge is a soft, circular piece of polyurethane foam with spermicide in it. The sponge helps block sperm from entering the uterus, and the spermicide kills sperm. To use it, you make it wet and then insert it into the vagina. It should be   inserted before sex, left in for at least 6 hours after sex, and removed and thrown away within 30 hours. Spermicides Spermicides are chemicals that kill or block sperm from entering the cervix and uterus. They can come as a cream, jelly, suppository, foam, or tablet. A spermicide should be inserted into the vagina with an applicator at least 10-15 minutes before sex to allow time for it to work. The process must be repeated every time you have sex. Spermicides do not require a prescription.   Intrauterine contraception Intrauterine device (IUD) An IUD is a T-shaped device that is put in a woman's uterus. There are two types:  Hormone IUD.This type contains progestin, a synthetic form of the hormone progesterone. This type can stay in place for 3-5 years.  Copper IUD.This type is wrapped in copper wire. It can stay in place for 10 years. Permanent methods of contraception Female tubal ligation In this method, a woman's fallopian tubes are sealed, tied, or blocked during surgery to prevent eggs from traveling to the uterus. Hysteroscopic sterilization In this method, a small, flexible insert is placed into each fallopian tube. The inserts  cause scar tissue to form in the fallopian tubes and block them, so sperm cannot reach an egg. The procedure takes about 3 months to be effective. Another form of birth control must be used during those 3 months. Female sterilization This is a procedure to tie off the tubes that carry sperm (vasectomy). After the procedure, the man can still ejaculate fluid (semen). Another form of birth control must be used for 3 months after the procedure. Natural planning methods Natural family planning In this method, a couple does not have sex on days when the woman could become pregnant. Calendar method In this method, the woman keeps track of the length of each menstrual cycle, identifies the days when pregnancy can happen, and does not have sex on those days. Ovulation method In this method, a couple avoids sex during ovulation. Symptothermal method This method involves not having sex during ovulation. The woman typically checks for ovulation by watching changes in her temperature and in the consistency of cervical mucus. Post-ovulation method In this method, a couple waits to have sex until after ovulation. Where to find more information  Centers for Disease Control and Prevention: www.cdc.gov Summary  Contraception, also called birth control, refers to methods or devices that prevent pregnancy.  Hormonal methods of contraception include implants, injections, pills, patches, vaginal rings, and emergency contraceptives.  Barrier methods of contraception can include female condoms, female condoms, diaphragms, cervical caps, sponges, and spermicides.  There are two types of IUDs (intrauterine devices). An IUD can be put in a woman's uterus to prevent pregnancy for 3-5 years.  Permanent sterilization can be done through a procedure for males and females. Natural family planning methods involve nothaving sex on days when the woman could become pregnant. This information is not intended to replace advice  given to you by your health care provider. Make sure you discuss any questions you have with your health care provider. Document Revised: 08/21/2019 Document Reviewed: 08/21/2019 Elsevier Patient Education  2021 [employer] Inc.   Breastfeeding  Choosing to breastfeed is one of the best decisions you can make for yourself and your baby. A change in hormones during pregnancy causes your breasts to make breast milk in your milk-producing glands. Hormones prevent breast milk from being released before your baby is born. They also prompt milk flow after birth. Once breastfeeding has begun, thoughts of   your baby, as well as his or her sucking or crying, can stimulate the release of milk from your milk-producing glands. Benefits of breastfeeding Research shows that breastfeeding offers many health benefits for infants and mothers. It also offers a cost-free and convenient way to feed your baby. For your baby  Your first milk (colostrum) helps your baby's digestive system to function better.  Special cells in your milk (antibodies) help your baby to fight off infections.  Breastfed babies are less likely to develop asthma, allergies, obesity, or type 2 diabetes. They are also at lower risk for sudden infant death syndrome (SIDS).  Nutrients in breast milk are better able to meet your baby's needs compared to infant formula.  Breast milk improves your baby's brain development. For you  Breastfeeding helps to create a very special bond between you and your baby.  Breastfeeding is convenient. Breast milk costs nothing and is always available at the correct temperature.  Breastfeeding helps to burn calories. It helps you to lose the weight that you gained during pregnancy.  Breastfeeding makes your uterus return faster to its size before pregnancy. It also slows bleeding (lochia) after you give birth.  Breastfeeding helps to lower your risk of developing type 2 diabetes, osteoporosis, rheumatoid  arthritis, cardiovascular disease, and breast, ovarian, uterine, and endometrial cancer later in life. Breastfeeding basics Starting breastfeeding  Find a comfortable place to sit or lie down, with your neck and back well-supported.  Place a pillow or a rolled-up blanket under your baby to bring him or her to the level of your breast (if you are seated). Nursing pillows are specially designed to help support your arms and your baby while you breastfeed.  Make sure that your baby's tummy (abdomen) is facing your abdomen.  Gently massage your breast. With your fingertips, massage from the outer edges of your breast inward toward the nipple. This encourages milk flow. If your milk flows slowly, you may need to continue this action during the feeding.  Support your breast with 4 fingers underneath and your thumb above your nipple (make the letter "C" with your hand). Make sure your fingers are well away from your nipple and your baby's mouth.  Stroke your baby's lips gently with your finger or nipple.  When your baby's mouth is open wide enough, quickly bring your baby to your breast, placing your entire nipple and as much of the areola as possible into your baby's mouth. The areola is the colored area around your nipple. ? More areola should be visible above your baby's upper lip than below the lower lip. ? Your baby's lips should be opened and extended outward (flanged) to ensure an adequate, comfortable latch. ? Your baby's tongue should be between his or her lower gum and your breast.  Make sure that your baby's mouth is correctly positioned around your nipple (latched). Your baby's lips should create a seal on your breast and be turned out (everted).  It is common for your baby to suck about 2-3 minutes in order to start the flow of breast milk. Latching Teaching your baby how to latch onto your breast properly is very important. An improper latch can cause nipple pain, decreased milk  supply, and poor weight gain in your baby. Also, if your baby is not latched onto your nipple properly, he or she may swallow some air during feeding. This can make your baby fussy. Burping your baby when you switch breasts during the feeding can help to get rid   of the air. However, teaching your baby to latch on properly is still the best way to prevent fussiness from swallowing air while breastfeeding. Signs that your baby has successfully latched onto your nipple  Silent tugging or silent sucking, without causing you pain. Infant's lips should be extended outward (flanged).  Swallowing heard between every 3-4 sucks once your milk has started to flow (after your let-down milk reflex occurs).  Muscle movement above and in front of his or her ears while sucking. Signs that your baby has not successfully latched onto your nipple  Sucking sounds or smacking sounds from your baby while breastfeeding.  Nipple pain. If you think your baby has not latched on correctly, slip your finger into the corner of your baby's mouth to break the suction and place it between your baby's gums. Attempt to start breastfeeding again. Signs of successful breastfeeding Signs from your baby  Your baby will gradually decrease the number of sucks or will completely stop sucking.  Your baby will fall asleep.  Your baby's body will relax.  Your baby will retain a small amount of milk in his or her mouth.  Your baby will let go of your breast by himself or herself. Signs from you  Breasts that have increased in firmness, weight, and size 1-3 hours after feeding.  Breasts that are softer immediately after breastfeeding.  Increased milk volume, as well as a change in milk consistency and color by the fifth day of breastfeeding.  Nipples that are not sore, cracked, or bleeding. Signs that your baby is getting enough milk  Wetting at least 1-2 diapers during the first 24 hours after birth.  Wetting at least 5-6  diapers every 24 hours for the first week after birth. The urine should be clear or pale yellow by the age of 5 days.  Wetting 6-8 diapers every 24 hours as your baby continues to grow and develop.  At least 3 stools in a 24-hour period by the age of 5 days. The stool should be soft and yellow.  At least 3 stools in a 24-hour period by the age of 7 days. The stool should be seedy and yellow.  No loss of weight greater than 10% of birth weight during the first 3 days of life.  Average weight gain of 4-7 oz (113-198 g) per week after the age of 4 days.  Consistent daily weight gain by the age of 5 days, without weight loss after the age of 2 weeks. After a feeding, your baby may spit up a small amount of milk. This is normal. Breastfeeding frequency and duration Frequent feeding will help you make more milk and can prevent sore nipples and extremely full breasts (breast engorgement). Breastfeed when you feel the need to reduce the fullness of your breasts or when your baby shows signs of hunger. This is called "breastfeeding on demand." Signs that your baby is hungry include:  Increased alertness, activity, or restlessness.  Movement of the head from side to side.  Opening of the mouth when the corner of the mouth or cheek is stroked (rooting).  Increased sucking sounds, smacking lips, cooing, sighing, or squeaking.  Hand-to-mouth movements and sucking on fingers or hands.  Fussing or crying. Avoid introducing a pacifier to your baby in the first 4-6 weeks after your baby is born. After this time, you may choose to use a pacifier. Research has shown that pacifier use during the first year of a baby's life decreases the risk of   sudden infant death syndrome (SIDS). Allow your baby to feed on each breast as long as he or she wants. When your baby unlatches or falls asleep while feeding from the first breast, offer the second breast. Because newborns are often sleepy in the first few weeks of  life, you may need to awaken your baby to get him or her to feed. Breastfeeding times will vary from baby to baby. However, the following rules can serve as a guide to help you make sure that your baby is properly fed:  Newborns (babies 4 weeks of age or younger) may breastfeed every 1-3 hours.  Newborns should not go without breastfeeding for longer than 3 hours during the day or 5 hours during the night.  You should breastfeed your baby a minimum of 8 times in a 24-hour period. Breast milk pumping Pumping and storing breast milk allows you to make sure that your baby is exclusively fed your breast milk, even at times when you are unable to breastfeed. This is especially important if you go back to work while you are still breastfeeding, or if you are not able to be present during feedings. Your lactation consultant can help you find a method of pumping that works best for you and give you guidelines about how long it is safe to store breast milk.      Caring for your breasts while you breastfeed Nipples can become dry, cracked, and sore while breastfeeding. The following recommendations can help keep your breasts moisturized and healthy:  Avoid using soap on your nipples.  Wear a supportive bra designed especially for nursing. Avoid wearing underwire-style bras or extremely tight bras (sports bras).  Air-dry your nipples for 3-4 minutes after each feeding.  Use only cotton bra pads to absorb leaked breast milk. Leaking of breast milk between feedings is normal.  Use lanolin on your nipples after breastfeeding. Lanolin helps to maintain your skin's normal moisture barrier. Pure lanolin is not harmful (not toxic) to your baby. You may also hand express a few drops of breast milk and gently massage that milk into your nipples and allow the milk to air-dry. In the first few weeks after giving birth, some women experience breast engorgement. Engorgement can make your breasts feel heavy, warm,  and tender to the touch. Engorgement peaks within 3-5 days after you give birth. The following recommendations can help to ease engorgement:  Completely empty your breasts while breastfeeding or pumping. You may want to start by applying warm, moist heat (in the shower or with warm, water-soaked hand towels) just before feeding or pumping. This increases circulation and helps the milk flow. If your baby does not completely empty your breasts while breastfeeding, pump any extra milk after he or she is finished.  Apply ice packs to your breasts immediately after breastfeeding or pumping, unless this is too uncomfortable for you. To do this: ? Put ice in a plastic bag. ? Place a towel between your skin and the bag. ? Leave the ice on for 20 minutes, 2-3 times a day.  Make sure that your baby is latched on and positioned properly while breastfeeding. If engorgement persists after 48 hours of following these recommendations, contact your health care provider or a lactation consultant. Overall health care recommendations while breastfeeding  Eat 3 healthy meals and 3 snacks every day. Well-nourished mothers who are breastfeeding need an additional 450-500 calories a day. You can meet this requirement by increasing the amount of a balanced diet that   you eat.  Drink enough water to keep your urine pale yellow or clear.  Rest often, relax, and continue to take your prenatal vitamins to prevent fatigue, stress, and low vitamin and mineral levels in your body (nutrient deficiencies).  Do not use any products that contain nicotine or tobacco, such as cigarettes and e-cigarettes. Your baby may be harmed by chemicals from cigarettes that pass into breast milk and exposure to secondhand smoke. If you need help quitting, ask your health care provider.  Avoid alcohol.  Do not use illegal drugs or marijuana.  Talk with your health care provider before taking any medicines. These include over-the-counter and  prescription medicines as well as vitamins and herbal supplements. Some medicines that may be harmful to your baby can pass through breast milk.  It is possible to become pregnant while breastfeeding. If birth control is desired, ask your health care provider about options that will be safe while breastfeeding your baby. Where to find more information: [employer]: www.llli.org Contact a health care provider if:  You feel like you want to stop breastfeeding or have become frustrated with breastfeeding.  Your nipples are cracked or bleeding.  Your breasts are red, tender, or warm.  You have: ? Painful breasts or nipples. ? A swollen area on either breast. ? A fever or chills. ? Nausea or vomiting. ? Drainage other than breast milk from your nipples.  Your breasts do not become full before feedings by the fifth day after you give birth.  You feel sad and depressed.  Your baby is: ? Too sleepy to eat well. ? Having trouble sleeping. ? More than 1 week old and wetting fewer than 6 diapers in a 24-hour period. ? Not gaining weight by 5 days of age.  Your baby has fewer than 3 stools in a 24-hour period.  Your baby's skin or the white parts of his or her eyes become yellow. Get help right away if:  Your baby is overly tired (lethargic) and does not want to wake up and feed.  Your baby develops an unexplained fever. Summary  Breastfeeding offers many health benefits for infant and mothers.  Try to breastfeed your infant when he or she shows early signs of hunger.  Gently tickle or stroke your baby's lips with your finger or nipple to allow the baby to open his or her mouth. Bring the baby to your breast. Make sure that much of the areola is in your baby's mouth. Offer one side and burp the baby before you offer the other side.  Talk with your health care provider or lactation consultant if you have questions or you face problems as you breastfeed. This  information is not intended to replace advice given to you by your health care provider. Make sure you discuss any questions you have with your health care provider. Document Revised: 06/10/2017 Document Reviewed: 04/17/2016 Elsevier Patient Education  2021 Elsevier Inc.  

## 2020-07-30 NOTE — Progress Notes (Signed)
Subjective:   Dawn Huff is a 25 y.o. G4P0030 at [redacted]w[redacted]d by early ultrasound being seen today for her first obstetrical visit.  Her obstetrical history is significant for smoker and opioid use disorder. Patient does intend to breast feed. Pregnancy history fully reviewed.  Patient reports no complaints.    HISTORY: OB History  Gravida Para Term Preterm AB Living  4 0 0 0 3 0  SAB IAB Ectopic Multiple Live Births  3 0 0 0 0    # Outcome Date GA Lbr Len/2nd Weight Sex Delivery Anes PTL Lv  4 Current           3 SAB           2 SAB           1 SAB            Last pap smear was  08/03/2014 and was ASCUS, neg HPV Past Medical History:  Diagnosis Date  . ADHD (attention deficit hyperactivity disorder)   . Contraceptive management 03/13/2013  . IBS (irritable bowel syndrome)   . Irregular bleeding 03/10/2013  . Lactose intolerance    History reviewed. No pertinent surgical history. Family History  Problem Relation Age of Onset  . Hypertension Maternal Grandmother   . Diabetes Maternal Grandmother    Social History   Tobacco Use  . Smoking status: Current Every Day Smoker    Packs/day: 0.50    Years: 6.00    Pack years: 3.00    Types: Cigarettes  . Smokeless tobacco: Never Used  Vaping Use  . Vaping Use: Every day  Substance Use Topics  . Alcohol use: Not Currently    Comment: occ  . Drug use: Not Currently    Types: Marijuana, Heroin    Comment: twice daily   No Known Allergies Current Outpatient Medications on File Prior to Visit  Medication Sig Dispense Refill  . lisdexamfetamine (VYVANSE) 50 MG capsule Take 50 mg by mouth every evening. (Patient not taking: Reported on 07/30/2020)    . lisdexamfetamine (VYVANSE) 70 MG capsule Take 70 mg by mouth every morning. (Patient not taking: Reported on 07/30/2020)    . norgestimate-ethinyl estradiol (ORTHO-CYCLEN,SPRINTEC,PREVIFEM) 0.25-35 MG-MCG tablet Take 1 tablet by mouth daily. (Patient not taking: Reported on  07/30/2020) 1 Package 11   No current facility-administered medications on file prior to visit.     Exam   Vitals:   07/30/20 1419  BP: 107/70  Pulse: 93  Weight: 149 lb (67.6 kg)   Fetal Heart Rate (bpm): 165  Uterus:     Pelvic Exam: Perineum: no hemorrhoids, normal perineum   Vulva: normal external genitalia, no lesions   Vagina:  normal mucosa, normal discharge   Cervix: no lesions and normal, pap smear done.   System: General: well-developed, well-nourished female in no acute distress   Skin: normal coloration and turgor, no rashes   Neurologic: oriented, normal, negative, normal mood   Extremities: normal strength, tone, and muscle mass, ROM of all joints is normal   HEENT PERRLA, extraocular movement intact and sclera clear, anicteric   Mouth/Teeth mucous membranes moist, pharynx normal without lesions and dental hygiene good   Neck supple and no masses   Respiratory:  no respiratory distress     Assessment:   Pregnancy: G4P0030 Patient Active Problem List   Diagnosis Date Noted  . Supervision of high risk pregnancy, antepartum 07/30/2020  . Opioid use disorder 07/30/2020  . Contraceptive management 03/13/2013  .  Irregular bleeding 03/10/2013     Plan:  1. Supervision of high risk pregnancy, antepartum Initial labs drawn. Pap collected today Continue prenatal vitamins. Genetic Screening discussed, NIPS: ordered. Ultrasound discussed; fetal anatomic survey: ordered. Problem list reviewed and updated. The nature of [facility] with multiple MDs and other Advanced Practice Providers was explained to patient; also emphasized that residents, students are part of our team. - [facility] BABYSCRIPTS SCHEDULE OPTIMIZATION  2. Opioid use disorder Patient reports prior to 04/2020 she had been using less than 0.5 g of intranasal opioids When she found out she was pregnant she began using illicit suboxone to control her symptoms of  withdrawal with success She is unsure of her exact dose as she usually takes a little less than a week to use up an 8 mg tablet which she cuts down into very small pieces She recently switched from Suboxone to Subutex as she was told this was better for the baby  She is amenable to UDS today Discussed that we will routinely get UDS to ensure adherence to medical therapy for OUD Discussed theoretical risk of effects to fetus from naloxone, however given she is out of the first trimester I am less worried about this. In addition numerous retrospective studies have shown no association with fetal defects. Furthermore there is decreased risk of abuse and diversion with Suboxone. After counseling on risks and benefits she agrees to proceed with ongoing Suboxone treatment.  Given she is already taking suboxone suggested we start her on 2 mg films and she can use PRN up to 8 mg daily Discussed she should consolidate prior days doses into one time daily doses, I.e. if she takes a 2mg  PRN in the morning and 2 mg PRN in the evening, she should take 4mg  the next morning as a single dose. She can then continue to PRN up to a maximum daily dose of 8 mg. I don't think she'll need to go above 8 mg given her report of current use but encouraged her to call if she's having issues.  Return to clinic in about one week to see Dr. , can assess how she feels at that time and if on a stable dose can give her a definitive script until her next appointment  Routine obstetric precautions reviewed. Return in 1 week (on 08/06/2020) for [facility], ob visit.

## 2020-07-30 NOTE — Progress Notes (Signed)
Patient filled out pregnancy risk screening form.  [place] completed.

## 2020-07-31 ENCOUNTER — Other Ambulatory Visit: Payer: Self-pay | Admitting: Family Medicine

## 2020-07-31 DIAGNOSIS — F1190 Opioid use, unspecified, uncomplicated: Secondary | ICD-10-CM

## 2020-07-31 LAB — PAIN MGT SCRN (14 DRUGS), UR
Amphetamine Scrn, Ur: NEGATIVE ng/mL
BARBITURATE SCREEN URINE: NEGATIVE ng/mL
BENZODIAZEPINE SCREEN, URINE: NEGATIVE ng/mL
Buprenorphine, Urine: NEGATIVE ng/mL
CANNABINOIDS UR QL SCN: POSITIVE ng/mL — AB
Cocaine (Metab) Scrn, Ur: NEGATIVE ng/mL
Creatinine(Crt), U: 74 mg/dL (ref 20.0–300.0)
Fentanyl, Urine: NEGATIVE pg/mL
Meperidine Screen, Urine: NEGATIVE ng/mL
Methadone Screen, Urine: NEGATIVE ng/mL
OXYCODONE+OXYMORPHONE UR QL SCN: NEGATIVE ng/mL
Opiate Scrn, Ur: NEGATIVE ng/mL
Ph of Urine: 7.7 (ref 4.5–8.9)
Phencyclidine Qn, Ur: NEGATIVE ng/mL
Propoxyphene Scrn, Ur: NEGATIVE ng/mL
Tramadol Screen, Urine: NEGATIVE ng/mL

## 2020-07-31 MED ORDER — BUPRENORPHINE HCL-NALOXONE HCL 2-0.5 MG SL FILM: ORAL_FILM | SUBLINGUAL | 0 refills | Status: DC

## 2020-07-31 NOTE — Progress Notes (Signed)
Re-sending DEA number with XE prefix per pharmacy request

## 2020-08-01 ENCOUNTER — Other Ambulatory Visit: Payer: Self-pay | Admitting: Family Medicine

## 2020-08-01 DIAGNOSIS — F1190 Opioid use, unspecified, uncomplicated: Secondary | ICD-10-CM

## 2020-08-01 LAB — HEMOGLOBIN A1C
Est. average glucose Bld gHb Est-mCnc: 100 mg/dL
Hgb A1c MFr Bld: 5.1 % (ref 4.8–5.6)

## 2020-08-01 LAB — CULTURE, OB URINE

## 2020-08-01 LAB — CYTOLOGY - PAP
Chlamydia: NEGATIVE
Comment: NEGATIVE
Comment: NORMAL
Diagnosis: NEGATIVE
Neisseria Gonorrhea: NEGATIVE

## 2020-08-01 LAB — URINE CULTURE, OB REFLEX

## 2020-08-01 MED ORDER — BUPRENORPHINE HCL-NALOXONE HCL 2-0.5 MG SL FILM
ORAL_FILM | SUBLINGUAL | 0 refills | Status: DC
Start: 2020-08-01 — End: 2020-08-07

## 2020-08-01 NOTE — Addendum Note (Signed)
Addended by: Merian Capron on: 08/01/2020 09:05 AM   Modules accepted: Orders

## 2020-08-01 NOTE — Progress Notes (Signed)
Sending suboxone rx to different pharmacy I called both [facility] and [facility] where her prior prescriptions were sent and cancelled those prescriptions personally

## 2020-08-05 ENCOUNTER — Ambulatory Visit: Payer: Medicaid Other | Admitting: *Deleted

## 2020-08-05 ENCOUNTER — Encounter: Payer: Medicaid Other | Admitting: Women's Health

## 2020-08-05 ENCOUNTER — Other Ambulatory Visit: Payer: Medicaid Other

## 2020-08-05 LAB — RPR+RH+ABO+RUB AB+AB SCR
Antibody Screen: NEGATIVE
RPR Ser Ql: NONREACTIVE
Rh Factor: POSITIVE
Rubella Antibodies, IGG: <0.9 index — ABNORMAL LOW (ref >0.99)

## 2020-08-05 LAB — CBC WITH DIFFERENTIAL/PLATELET
Basophils Absolute: 0 10*3/uL (ref 0.0–0.2)
Basos: 0 %
EOS (ABSOLUTE): 0.2 10*3/uL (ref 0.0–0.4)
Eos: 3 %
Hematocrit: 38.7 % (ref 34.0–46.6)
Hemoglobin: 12.6 g/dL (ref 11.1–15.9)
Immature Grans (Abs): 0 10*3/uL (ref 0.0–0.1)
Immature Granulocytes: 0 %
Lymphocytes Absolute: 2.8 10*3/uL (ref 0.7–3.1)
Lymphs: 31 %
MCH: 31 pg (ref 26.6–33.0)
MCHC: 32.6 g/dL (ref 31.5–35.7)
MCV: 95 fL (ref 79–97)
Monocytes Absolute: 0.5 10*3/uL (ref 0.1–0.9)
Monocytes: 6 %
Neutrophils Absolute: 5.5 10*3/uL (ref 1.4–7.0)
Neutrophils: 60 %
Platelets: 312 10*3/uL (ref 150–450)
RBC: 4.07 x10E6/uL (ref 3.77–5.28)
RDW: 13.1 % (ref 11.7–15.4)
WBC: 9.1 10*3/uL (ref 3.4–10.8)

## 2020-08-05 LAB — HCV AB W REFLEX TO QUANT PCR: HCV Ab: <0.1 s/co ratio (ref 0.0–0.9)

## 2020-08-05 LAB — HIV ANTIBODY (ROUTINE TESTING W REFLEX): HIV Screen 4th Generation wRfx: NONREACTIVE

## 2020-08-05 LAB — SPECIMEN STATUS REPORT

## 2020-08-05 LAB — HEPATITIS B SURFACE ANTIGEN: Hepatitis B Surface Ag: NEGATIVE

## 2020-08-05 LAB — HCV INTERPRETATION

## 2020-08-06 ENCOUNTER — Telehealth: Payer: Self-pay

## 2020-08-06 NOTE — Telephone Encounter (Signed)
Received Fax from [place] requesting callback reg [employer] Referral form, stating both Panels were selected. Spoke with Marchelle Folks @ [phone] & advised of the correct Panel & that the 2nd one was crossed out. Marchelle Folks stated that it did look that way but they needed to make sure. Will complete testing.

## 2020-08-07 ENCOUNTER — Other Ambulatory Visit: Payer: Self-pay | Admitting: Family Medicine

## 2020-08-07 ENCOUNTER — Other Ambulatory Visit: Payer: Self-pay

## 2020-08-07 ENCOUNTER — Ambulatory Visit (INDEPENDENT_AMBULATORY_CARE_PROVIDER_SITE_OTHER): Payer: Medicaid Other | Admitting: Obstetrics & Gynecology

## 2020-08-07 VITALS — BP 120/66 | HR 102 | Wt 152.0 lb

## 2020-08-07 DIAGNOSIS — F1190 Opioid use, unspecified, uncomplicated: Secondary | ICD-10-CM

## 2020-08-07 DIAGNOSIS — F17209 Nicotine dependence, unspecified, with unspecified nicotine-induced disorders: Secondary | ICD-10-CM

## 2020-08-07 DIAGNOSIS — O0990 Supervision of high risk pregnancy, unspecified, unspecified trimester: Secondary | ICD-10-CM

## 2020-08-07 MED ORDER — BUPRENORPHINE HCL-NALOXONE HCL 2-0.5 MG SL FILM: 1.0000 | ORAL_FILM | Freq: Two times a day (BID) | SUBLINGUAL | 0 refills | Status: DC

## 2020-08-07 NOTE — Progress Notes (Signed)
Spoke with patient about Suboxone therapy. She endorses being controlled on 1-2 films per day (2-4mg  of buprenorphine). She mentions wanting her prescription to be decreased from a maximum of 4 films a day to 2 films per day to prevent excess of available prescriptions as it would be a temptation. Counseled that this would be acceptable and we can always titrate back up if she begins experiencing symptoms of withdrawal or cravings.   Patient also had questions about Suboxone in breastfeeding and treatment of baby once born. I discussed the benefits that breastfeeding provides to the baby and the low amount of Suboxone in breastmilk. Additionally, discussed nonpharmacologic interventions (such as skin-to-skin, feeding on demand) that benefit baby and help prevent pharmacologic treatment of withdrawal, should that be an issue at delivery.   Answered all questions to patient's satisfaction.

## 2020-08-07 NOTE — Patient Instructions (Signed)

## 2020-08-07 NOTE — Progress Notes (Signed)
Tried to call patient to refill suboxone.  No answer and mailbox not set up.  No pharmacy on file. Will try to call tomorrow.   08/08/20 Contacted patient. She has 27 strips left from initial prescription. Taking 2 strips per day. She has enough until her next visit in a week.  Patient encouraged to contact us with any problems.  Levie Heritage, DO

## 2020-08-07 NOTE — Progress Notes (Signed)
   PRENATAL VISIT NOTE  Subjective:  Dawn Huff is a 25 y.o. G4P0030 at [redacted]w[redacted]d being seen today for ongoing prenatal care.  She is currently monitored for the following issues for this high-risk pregnancy and has Supervision of high risk pregnancy, antepartum and Opioid use disorder on their problem list.  Patient reports relates history of possible seizures since age 37.  Contractions: Not present. Vag. Bleeding: None.  Movement: Absent. Denies leaking of fluid.   The following portions of the patient's history were reviewed and updated as appropriate: allergies, current medications, past family history, past medical history, past social history, past surgical history and problem list.   Objective:   Vitals:   08/07/20 1422  BP: 120/66  Pulse: (!) 102  Weight: 152 lb (68.9 kg)    Fetal Status: Fetal Heart Rate (bpm): 158   Movement: Absent     General:  Alert, oriented and cooperative. Patient is in no acute distress.  Skin: Skin is warm and dry. No rash noted.   Cardiovascular: Normal heart rate noted  Respiratory: Normal respiratory effort, no problems with respiration noted  Abdomen: Soft, gravid, appropriate for gestational age.  Pain/Pressure: Present     Pelvic: Cervical exam deferred        Extremities: Normal range of motion.  Edema: None  Mental Status: Normal mood and affect. Normal behavior. Normal judgment and thought content.   Assessment and Plan:  Pregnancy: G4P0030 at [redacted]w[redacted]d 1. Supervision of high risk pregnancy, antepartum Needs evaluation of questionable seizure history - Ambulatory referral to Neurology  2. Opioid use disorder rx given - Ambulatory referral to Neurology - Buprenorphine HCl-Naloxone HCl (SUBOXONE) 2-0.5 MG FILM; Place 1 Film under the tongue in the morning and at bedtime.  Dispense: 14 each; Refill: 0  3. Nicotine dependence with nicotine-induced disorder, unspecified nicotine product type vape and cigarettes  Preterm labor symptoms and  general obstetric precautions including but not limited to vaginal bleeding, contractions, leaking of fluid and fetal movement were reviewed in detail with the patient. Please refer to After Visit Summary for other counseling recommendations.   No follow-ups on file.  Future Appointments  Date Time Provider Department Center  08/14/2020  9:35 AM Venora Maples, MD [facility]  09/17/2020  1:30 PM [facility] NURSE [facility]  09/17/2020  1:45 PM [facility]    Scheryl Darter, MD

## 2020-08-08 MED ORDER — BUPRENORPHINE HCL-NALOXONE HCL 2-0.5 MG SL FILM: 1.0000 | ORAL_FILM | Freq: Two times a day (BID) | SUBLINGUAL | 0 refills | Status: DC

## 2020-08-09 ENCOUNTER — Encounter: Payer: Self-pay | Admitting: Neurology

## 2020-08-13 ENCOUNTER — Encounter: Payer: Self-pay | Admitting: Family Medicine

## 2020-08-13 DIAGNOSIS — Z2839 Other underimmunization status: Secondary | ICD-10-CM | POA: Insufficient documentation

## 2020-08-13 DIAGNOSIS — O09899 Supervision of other high risk pregnancies, unspecified trimester: Secondary | ICD-10-CM | POA: Insufficient documentation

## 2020-08-14 ENCOUNTER — Other Ambulatory Visit: Payer: Self-pay

## 2020-08-14 ENCOUNTER — Encounter: Payer: Self-pay | Admitting: Family Medicine

## 2020-08-14 ENCOUNTER — Ambulatory Visit (INDEPENDENT_AMBULATORY_CARE_PROVIDER_SITE_OTHER): Payer: Medicaid Other | Admitting: Family Medicine

## 2020-08-14 VITALS — BP 99/71 | HR 94 | Wt 154.0 lb

## 2020-08-14 DIAGNOSIS — F1190 Opioid use, unspecified, uncomplicated: Secondary | ICD-10-CM

## 2020-08-14 DIAGNOSIS — O0990 Supervision of high risk pregnancy, unspecified, unspecified trimester: Secondary | ICD-10-CM

## 2020-08-14 MED ORDER — POLYETHYLENE GLYCOL 3350 17 GM/SCOOP PO POWD: 17.0000 g | Freq: Every day | ORAL | 1 refills | Status: DC | PRN

## 2020-08-14 MED ORDER — PROMETHAZINE HCL 25 MG PO TABS: 25.0000 mg | ORAL_TABLET | Freq: Four times a day (QID) | ORAL | 1 refills | Status: DC | PRN

## 2020-08-14 NOTE — Patient Instructions (Signed)

## 2020-08-14 NOTE — Progress Notes (Addendum)
   Subjective:  Angelin Cutrone is a 25 y.o. G4P0030 at [redacted]w[redacted]d being seen today for ongoing prenatal care.  She is currently monitored for the following issues for this high-risk pregnancy and has Supervision of high risk pregnancy, antepartum; Opioid use disorder; and Rubella non-immune status, antepartum on their problem list.  Patient reports no complaints.  Contractions: Not present. Vag. Bleeding: None.  Movement: Absent. Denies leaking of fluid.   The following portions of the patient's history were reviewed and updated as appropriate: allergies, current medications, past family history, past medical history, past social history, past surgical history and problem list. Problem list updated.  Objective:   Vitals:   08/14/20 1151  BP: 99/71  Pulse: 94  Weight: 154 lb (69.9 kg)    Fetal Status: Fetal Heart Rate (bpm): 152   Movement: Absent     General:  Alert, oriented and cooperative. Patient is in no acute distress.  Skin: Skin is warm and dry. No rash noted.   Cardiovascular: Normal heart rate noted  Respiratory: Normal respiratory effort, no problems with respiration noted  Abdomen: Soft, gravid, appropriate for gestational age. Pain/Pressure: Absent     Pelvic: Vag. Bleeding: None     Cervical exam deferred        Extremities: Normal range of motion.  Edema: None  Mental Status: Normal mood and affect. Normal behavior. Normal judgment and thought content.   Urinalysis:      Assessment and Plan:  Pregnancy: G4P0030 at [redacted]w[redacted]d  1. Supervision of high risk pregnancy, antepartum BP and FHR normal   2. Opioid use disorder Reports symptoms are well controlled on 2 mg BID UDS today Has enough strips from initial prescription to last until next visit - Pain Mgt Scrn (14 Drugs), Ur  Preterm labor symptoms and general obstetric precautions including but not limited to vaginal bleeding, contractions, leaking of fluid and fetal movement were reviewed in detail with the  patient. Please refer to After Visit Summary for other counseling recommendations.  Return in about 2 weeks (around 08/28/2020) for [facility], ob visit, needs MD.   Venora Maples, MD

## 2020-08-15 ENCOUNTER — Encounter: Payer: Self-pay | Admitting: *Deleted

## 2020-08-15 LAB — BUPRENORPHINE CONFIRM, URINE
Buprenorphine: NEGATIVE
Buprenorphine: POSITIVE — AB
Norbuprenorphine Confirm: 28 ng/mL
Norbuprenorphine: POSITIVE — AB

## 2020-08-15 LAB — SPECIMEN STATUS REPORT

## 2020-08-23 ENCOUNTER — Other Ambulatory Visit: Payer: Self-pay

## 2020-08-23 DIAGNOSIS — F1190 Opioid use, unspecified, uncomplicated: Secondary | ICD-10-CM

## 2020-08-27 ENCOUNTER — Other Ambulatory Visit: Payer: Self-pay

## 2020-08-27 ENCOUNTER — Other Ambulatory Visit: Payer: Self-pay | Admitting: Family Medicine

## 2020-08-27 ENCOUNTER — Telehealth: Payer: Self-pay | Admitting: *Deleted

## 2020-08-27 DIAGNOSIS — F1190 Opioid use, unspecified, uncomplicated: Secondary | ICD-10-CM

## 2020-08-27 MED ORDER — SUBOXONE 2-0.5 MG SL FILM
1.0000 | ORAL_FILM | Freq: Two times a day (BID) | SUBLINGUAL | 0 refills | Status: DC
Start: 2020-08-27 — End: 2020-08-27

## 2020-08-27 MED ORDER — SUBOXONE 2-0.5 MG SL FILM: 1.0000 | ORAL_FILM | Freq: Two times a day (BID) | SUBLINGUAL | 0 refills | Status: DC

## 2020-08-27 NOTE — Telephone Encounter (Addendum)
Dawn Huff called and left a message she needs [place] to send in refill of her medicine. States she requested earlier but it is not there. Per chart review requested earlier via MyChart and rx sent in by Dr. Crissie Reese. I called [facility] and they did not receive order. Per chart review error on RX. I notified Dr. Crissie Reese and he resent the prescription. I called [facility] and they did receive the RX this time. I called [name] and informed her RX was sent initially but did not go through for some reason, but has been resent and she can pick up her RX in 2- . I apologized for the delay. She voices understanding. [name],RN

## 2020-08-27 NOTE — Progress Notes (Signed)
Refill for suboxone Will refill until next visit in one week Needs refills tied to visits in the future

## 2020-09-03 ENCOUNTER — Ambulatory Visit (INDEPENDENT_AMBULATORY_CARE_PROVIDER_SITE_OTHER): Payer: Medicaid Other | Admitting: Family Medicine

## 2020-09-03 ENCOUNTER — Encounter: Payer: Self-pay | Admitting: Family Medicine

## 2020-09-03 ENCOUNTER — Other Ambulatory Visit: Payer: Self-pay

## 2020-09-03 VITALS — BP 114/68 | HR 102 | Wt 160.8 lb

## 2020-09-03 DIAGNOSIS — O0990 Supervision of high risk pregnancy, unspecified, unspecified trimester: Secondary | ICD-10-CM

## 2020-09-03 DIAGNOSIS — F1190 Opioid use, unspecified, uncomplicated: Secondary | ICD-10-CM

## 2020-09-03 DIAGNOSIS — Z2839 Other underimmunization status: Secondary | ICD-10-CM

## 2020-09-03 MED ORDER — SUBOXONE 2-0.5 MG SL FILM: ORAL_FILM | SUBLINGUAL | 0 refills | Status: DC

## 2020-09-03 MED ORDER — PROMETHAZINE HCL 25 MG PO TABS: 25.0000 mg | ORAL_TABLET | Freq: Four times a day (QID) | ORAL | 2 refills | Status: DC | PRN

## 2020-09-03 NOTE — Patient Instructions (Signed)
 Contraception Choices Contraception, also called birth control, refers to methods or devices that prevent pregnancy. Hormonal methods Contraceptive implant A contraceptive implant is a thin, plastic tube that contains a hormone that prevents pregnancy. It is different from an intrauterine device (IUD). It is inserted into the upper part of the arm by a health care provider. Implants can be effective for up to 3 years. Progestin-only injections Progestin-only injections are injections of progestin, a synthetic form of the hormone progesterone. They are given every 3 months by a health care provider. Birth control pills Birth control pills are pills that contain hormones that prevent pregnancy. They must be taken once a day, preferably at the same time each day. A prescription is needed to use this method of contraception. Birth control patch The birth control patch contains hormones that prevent pregnancy. It is placed on the skin and must be changed once a week for three weeks and removed on the fourth week. A prescription is needed to use this method of contraception. Vaginal ring A vaginal ring contains hormones that prevent pregnancy. It is placed in the vagina for three weeks and removed on the fourth week. After that, the process is repeated with a new ring. A prescription is needed to use this method of contraception. Emergency contraceptive Emergency contraceptives prevent pregnancy after unprotected sex. They come in pill form and can be taken up to 5 days after sex. They work best the sooner they are taken after having sex. Most emergency contraceptives are available without a prescription. This method should not be used as your only form of birth control.   Barrier methods Female condom A female condom is a thin sheath that is worn over the penis during sex. Condoms keep sperm from going inside a woman's body. They can be used with a sperm-killing substance (spermicide) to increase their  effectiveness. They should be thrown away after one use. Female condom A female condom is a soft, loose-fitting sheath that is put into the vagina before sex. The condom keeps sperm from going inside a woman's body. They should be thrown away after one use. Diaphragm A diaphragm is a soft, dome-shaped barrier. It is inserted into the vagina before sex, along with a spermicide. The diaphragm blocks sperm from entering the uterus, and the spermicide kills sperm. A diaphragm should be left in the vagina for 6-8 hours after sex and removed within 24 hours. A diaphragm is prescribed and fitted by a health care provider. A diaphragm should be replaced every 1-2 years, after giving birth, after gaining more than 15 lb (6.8 kg), and after pelvic surgery. Cervical cap A cervical cap is a round, soft latex or plastic cup that fits over the cervix. It is inserted into the vagina before sex, along with spermicide. It blocks sperm from entering the uterus. The cap should be left in place for 6-8 hours after sex and removed within 48 hours. A cervical cap must be prescribed and fitted by a health care provider. It should be replaced every 2 years. Sponge A sponge is a soft, circular piece of polyurethane foam with spermicide in it. The sponge helps block sperm from entering the uterus, and the spermicide kills sperm. To use it, you make it wet and then insert it into the vagina. It should be inserted before sex, left in for at least 6 hours after sex, and removed and thrown away within 30 hours. Spermicides Spermicides are chemicals that kill or block sperm from entering the   cervix and uterus. They can come as a cream, jelly, suppository, foam, or tablet. A spermicide should be inserted into the vagina with an applicator at least 10-15 minutes before sex to allow time for it to work. The process must be repeated every time you have sex. Spermicides do not require a prescription.   Intrauterine  contraception Intrauterine device (IUD) An IUD is a T-shaped device that is put in a woman's uterus. There are two types:  Hormone IUD.This type contains progestin, a synthetic form of the hormone progesterone. This type can stay in place for 3-5 years.  Copper IUD.This type is wrapped in copper wire. It can stay in place for 10 years. Permanent methods of contraception Female tubal ligation In this method, a woman's fallopian tubes are sealed, tied, or blocked during surgery to prevent eggs from traveling to the uterus. Hysteroscopic sterilization In this method, a small, flexible insert is placed into each fallopian tube. The inserts cause scar tissue to form in the fallopian tubes and block them, so sperm cannot reach an egg. The procedure takes about 3 months to be effective. Another form of birth control must be used during those 3 months. Female sterilization This is a procedure to tie off the tubes that carry sperm (vasectomy). After the procedure, the man can still ejaculate fluid (semen). Another form of birth control must be used for 3 months after the procedure. Natural planning methods Natural family planning In this method, a couple does not have sex on days when the woman could become pregnant. Calendar method In this method, the woman keeps track of the length of each menstrual cycle, identifies the days when pregnancy can happen, and does not have sex on those days. Ovulation method In this method, a couple avoids sex during ovulation. Symptothermal method This method involves not having sex during ovulation. The woman typically checks for ovulation by watching changes in her temperature and in the consistency of cervical mucus. Post-ovulation method In this method, a couple waits to have sex until after ovulation. Where to find more information  Centers for Disease Control and Prevention: www.cdc.gov Summary  Contraception, also called birth control, refers to methods or  devices that prevent pregnancy.  Hormonal methods of contraception include implants, injections, pills, patches, vaginal rings, and emergency contraceptives.  Barrier methods of contraception can include female condoms, female condoms, diaphragms, cervical caps, sponges, and spermicides.  There are two types of IUDs (intrauterine devices). An IUD can be put in a woman's uterus to prevent pregnancy for 3-5 years.  Permanent sterilization can be done through a procedure for males and females. Natural family planning methods involve nothaving sex on days when the woman could become pregnant. This information is not intended to replace advice given to you by your health care provider. Make sure you discuss any questions you have with your health care provider. Document Revised: 08/21/2019 Document Reviewed: 08/21/2019 Elsevier Patient Education  2021 [employer] Inc.   Breastfeeding  Choosing to breastfeed is one of the best decisions you can make for yourself and your baby. A change in hormones during pregnancy causes your breasts to make breast milk in your milk-producing glands. Hormones prevent breast milk from being released before your baby is born. They also prompt milk flow after birth. Once breastfeeding has begun, thoughts of your baby, as well as his or her sucking or crying, can stimulate the release of milk from your milk-producing glands. Benefits of breastfeeding Research shows that breastfeeding offers many health benefits   for infants and mothers. It also offers a cost-free and convenient way to feed your baby. For your baby  Your first milk (colostrum) helps your baby's digestive system to function better.  Special cells in your milk (antibodies) help your baby to fight off infections.  Breastfed babies are less likely to develop asthma, allergies, obesity, or type 2 diabetes. They are also at lower risk for sudden infant death syndrome (SIDS).  Nutrients in breast milk are better  able to meet your baby's needs compared to infant formula.  Breast milk improves your baby's brain development. For you  Breastfeeding helps to create a very special bond between you and your baby.  Breastfeeding is convenient. Breast milk costs nothing and is always available at the correct temperature.  Breastfeeding helps to burn calories. It helps you to lose the weight that you gained during pregnancy.  Breastfeeding makes your uterus return faster to its size before pregnancy. It also slows bleeding (lochia) after you give birth.  Breastfeeding helps to lower your risk of developing type 2 diabetes, osteoporosis, rheumatoid arthritis, cardiovascular disease, and breast, ovarian, uterine, and endometrial cancer later in life. Breastfeeding basics Starting breastfeeding  Find a comfortable place to sit or lie down, with your neck and back well-supported.  Place a pillow or a rolled-up blanket under your baby to bring him or her to the level of your breast (if you are seated). Nursing pillows are specially designed to help support your arms and your baby while you breastfeed.  Make sure that your baby's tummy (abdomen) is facing your abdomen.  Gently massage your breast. With your fingertips, massage from the outer edges of your breast inward toward the nipple. This encourages milk flow. If your milk flows slowly, you may need to continue this action during the feeding.  Support your breast with 4 fingers underneath and your thumb above your nipple (make the letter "C" with your hand). Make sure your fingers are well away from your nipple and your baby's mouth.  Stroke your baby's lips gently with your finger or nipple.  When your baby's mouth is open wide enough, quickly bring your baby to your breast, placing your entire nipple and as much of the areola as possible into your baby's mouth. The areola is the colored area around your nipple. ? More areola should be visible above your  baby's upper lip than below the lower lip. ? Your baby's lips should be opened and extended outward (flanged) to ensure an adequate, comfortable latch. ? Your baby's tongue should be between his or her lower gum and your breast.  Make sure that your baby's mouth is correctly positioned around your nipple (latched). Your baby's lips should create a seal on your breast and be turned out (everted).  It is common for your baby to suck about 2-3 minutes in order to start the flow of breast milk. Latching Teaching your baby how to latch onto your breast properly is very important. An improper latch can cause nipple pain, decreased milk supply, and poor weight gain in your baby. Also, if your baby is not latched onto your nipple properly, he or she may swallow some air during feeding. This can make your baby fussy. Burping your baby when you switch breasts during the feeding can help to get rid of the air. However, teaching your baby to latch on properly is still the best way to prevent fussiness from swallowing air while breastfeeding. Signs that your baby has successfully latched onto   your nipple  Silent tugging or silent sucking, without causing you pain. Infant's lips should be extended outward (flanged).  Swallowing heard between every 3-4 sucks once your milk has started to flow (after your let-down milk reflex occurs).  Muscle movement above and in front of his or her ears while sucking. Signs that your baby has not successfully latched onto your nipple  Sucking sounds or smacking sounds from your baby while breastfeeding.  Nipple pain. If you think your baby has not latched on correctly, slip your finger into the corner of your baby's mouth to break the suction and place it between your baby's gums. Attempt to start breastfeeding again. Signs of successful breastfeeding Signs from your baby  Your baby will gradually decrease the number of sucks or will completely stop sucking.  Your baby  will fall asleep.  Your baby's body will relax.  Your baby will retain a small amount of milk in his or her mouth.  Your baby will let go of your breast by himself or herself. Signs from you  Breasts that have increased in firmness, weight, and size 1-3 hours after feeding.  Breasts that are softer immediately after breastfeeding.  Increased milk volume, as well as a change in milk consistency and color by the fifth day of breastfeeding.  Nipples that are not sore, cracked, or bleeding. Signs that your baby is getting enough milk  Wetting at least 1-2 diapers during the first 24 hours after birth.  Wetting at least 5-6 diapers every 24 hours for the first week after birth. The urine should be clear or pale yellow by the age of 5 days.  Wetting 6-8 diapers every 24 hours as your baby continues to grow and develop.  At least 3 stools in a 24-hour period by the age of 5 days. The stool should be soft and yellow.  At least 3 stools in a 24-hour period by the age of 7 days. The stool should be seedy and yellow.  No loss of weight greater than 10% of birth weight during the first 3 days of life.  Average weight gain of 4-7 oz (113-198 g) per week after the age of 4 days.  Consistent daily weight gain by the age of 5 days, without weight loss after the age of 2 weeks. After a feeding, your baby may spit up a small amount of milk. This is normal. Breastfeeding frequency and duration Frequent feeding will help you make more milk and can prevent sore nipples and extremely full breasts (breast engorgement). Breastfeed when you feel the need to reduce the fullness of your breasts or when your baby shows signs of hunger. This is called "breastfeeding on demand." Signs that your baby is hungry include:  Increased alertness, activity, or restlessness.  Movement of the head from side to side.  Opening of the mouth when the corner of the mouth or cheek is stroked (rooting).  Increased  sucking sounds, smacking lips, cooing, sighing, or squeaking.  Hand-to-mouth movements and sucking on fingers or hands.  Fussing or crying. Avoid introducing a pacifier to your baby in the first 4-6 weeks after your baby is born. After this time, you may choose to use a pacifier. Research has shown that pacifier use during the first year of a baby's life decreases the risk of sudden infant death syndrome (SIDS). Allow your baby to feed on each breast as long as he or she wants. When your baby unlatches or falls asleep while feeding from the   first breast, offer the second breast. Because newborns are often sleepy in the first few weeks of life, you may need to awaken your baby to get him or her to feed. Breastfeeding times will vary from baby to baby. However, the following rules can serve as a guide to help you make sure that your baby is properly fed:  Newborns (babies 4 weeks of age or younger) may breastfeed every 1-3 hours.  Newborns should not go without breastfeeding for longer than 3 hours during the day or 5 hours during the night.  You should breastfeed your baby a minimum of 8 times in a 24-hour period. Breast milk pumping Pumping and storing breast milk allows you to make sure that your baby is exclusively fed your breast milk, even at times when you are unable to breastfeed. This is especially important if you go back to work while you are still breastfeeding, or if you are not able to be present during feedings. Your lactation consultant can help you find a method of pumping that works best for you and give you guidelines about how long it is safe to store breast milk.      Caring for your breasts while you breastfeed Nipples can become dry, cracked, and sore while breastfeeding. The following recommendations can help keep your breasts moisturized and healthy:  Avoid using soap on your nipples.  Wear a supportive bra designed especially for nursing. Avoid wearing underwire-style  bras or extremely tight bras (sports bras).  Air-dry your nipples for 3-4 minutes after each feeding.  Use only cotton bra pads to absorb leaked breast milk. Leaking of breast milk between feedings is normal.  Use lanolin on your nipples after breastfeeding. Lanolin helps to maintain your skin's normal moisture barrier. Pure lanolin is not harmful (not toxic) to your baby. You may also hand express a few drops of breast milk and gently massage that milk into your nipples and allow the milk to air-dry. In the first few weeks after giving birth, some women experience breast engorgement. Engorgement can make your breasts feel heavy, warm, and tender to the touch. Engorgement peaks within 3-5 days after you give birth. The following recommendations can help to ease engorgement:  Completely empty your breasts while breastfeeding or pumping. You may want to start by applying warm, moist heat (in the shower or with warm, water-soaked hand towels) just before feeding or pumping. This increases circulation and helps the milk flow. If your baby does not completely empty your breasts while breastfeeding, pump any extra milk after he or she is finished.  Apply ice packs to your breasts immediately after breastfeeding or pumping, unless this is too uncomfortable for you. To do this: ? Put ice in a plastic bag. ? Place a towel between your skin and the bag. ? Leave the ice on for 20 minutes, 2-3 times a day.  Make sure that your baby is latched on and positioned properly while breastfeeding. If engorgement persists after 48 hours of following these recommendations, contact your health care provider or a lactation consultant. Overall health care recommendations while breastfeeding  Eat 3 healthy meals and 3 snacks every day. Well-nourished mothers who are breastfeeding need an additional 450-500 calories a day. You can meet this requirement by increasing the amount of a balanced diet that you eat.  Drink  enough water to keep your urine pale yellow or clear.  Rest often, relax, and continue to take your prenatal vitamins to prevent fatigue, stress, and low   vitamin and mineral levels in your body (nutrient deficiencies).  Do not use any products that contain nicotine or tobacco, such as cigarettes and e-cigarettes. Your baby may be harmed by chemicals from cigarettes that pass into breast milk and exposure to secondhand smoke. If you need help quitting, ask your health care provider.  Avoid alcohol.  Do not use illegal drugs or marijuana.  Talk with your health care provider before taking any medicines. These include over-the-counter and prescription medicines as well as vitamins and herbal supplements. Some medicines that may be harmful to your baby can pass through breast milk.  It is possible to become pregnant while breastfeeding. If birth control is desired, ask your health care provider about options that will be safe while breastfeeding your baby. Where to find more information: [employer]: www.llli.org Contact a health care provider if:  You feel like you want to stop breastfeeding or have become frustrated with breastfeeding.  Your nipples are cracked or bleeding.  Your breasts are red, tender, or warm.  You have: ? Painful breasts or nipples. ? A swollen area on either breast. ? A fever or chills. ? Nausea or vomiting. ? Drainage other than breast milk from your nipples.  Your breasts do not become full before feedings by the fifth day after you give birth.  You feel sad and depressed.  Your baby is: ? Too sleepy to eat well. ? Having trouble sleeping. ? More than 1 week old and wetting fewer than 6 diapers in a 24-hour period. ? Not gaining weight by 5 days of age.  Your baby has fewer than 3 stools in a 24-hour period.  Your baby's skin or the white parts of his or her eyes become yellow. Get help right away if:  Your baby is overly tired  (lethargic) and does not want to wake up and feed.  Your baby develops an unexplained fever. Summary  Breastfeeding offers many health benefits for infant and mothers.  Try to breastfeed your infant when he or she shows early signs of hunger.  Gently tickle or stroke your baby's lips with your finger or nipple to allow the baby to open his or her mouth. Bring the baby to your breast. Make sure that much of the areola is in your baby's mouth. Offer one side and burp the baby before you offer the other side.  Talk with your health care provider or lactation consultant if you have questions or you face problems as you breastfeed. This information is not intended to replace advice given to you by your health care provider. Make sure you discuss any questions you have with your health care provider. Document Revised: 06/10/2017 Document Reviewed: 04/17/2016 Elsevier Patient Education  2021 Elsevier Inc.  

## 2020-09-03 NOTE — Progress Notes (Signed)
   Subjective:  Dawn Huff is a 25 y.o. G4P0030 at [redacted]w[redacted]d being seen today for ongoing prenatal care.  She is currently monitored for the following issues for this high-risk pregnancy and has Supervision of high risk pregnancy, antepartum; Opioid use disorder; and Rubella non-immune status, antepartum on their problem list.  Patient reports no complaints.  Contractions: Not present. Vag. Bleeding: None.  Movement: Present. Denies leaking of fluid.    The following portions of the patient's history were reviewed and updated as appropriate: allergies, current medications, past family history, past medical history, past social history, past surgical history and problem list. Problem list updated.  Objective:   Vitals:   09/03/20 1448  BP: 114/68  Pulse: (!) 102  Weight: 160 lb 12.8 oz (72.9 kg)    Fetal Status: Fetal Heart Rate (bpm): 152   Movement: Present     General:  Alert, oriented and cooperative. Patient is in no acute distress.  Skin: Skin is warm and dry. No rash noted.   Cardiovascular: Normal heart rate noted  Respiratory: Normal respiratory effort, no problems with respiration noted  Abdomen: Soft, gravid, appropriate for gestational age. Pain/Pressure: Absent     Pelvic: Vag. Bleeding: None     Cervical exam deferred        Extremities: Normal range of motion.  Edema: None  Mental Status: Normal mood and affect. Normal behavior. Normal judgment and thought content.   Urinalysis:      Assessment and Plan:  Pregnancy: G4P0030 at [redacted]w[redacted]d  1. Supervision of high risk pregnancy, antepartum BP and FHR normal AFP today  2. Rubella non-immune status, antepartum   3. Opioid use disorder Stable, currently taking 2mg  AM and 1mg  PM Discussed she will probably need to uptitrate, ctm symptoms UDS today Refill sent for suboxone  Preterm labor symptoms and general obstetric precautions including but not limited to vaginal bleeding, contractions, leaking of fluid and fetal  movement were reviewed in detail with the patient. Please refer to After Visit Summary for other counseling recommendations.  Return in 2 weeks (on 09/17/2020) for [facility], ob visit.   09/19/2020, MD

## 2020-09-05 LAB — PAIN MGT SCRN (14 DRUGS), UR
Amphetamine Scrn, Ur: NEGATIVE ng/mL
BARBITURATE SCREEN URINE: NEGATIVE ng/mL
BENZODIAZEPINE SCREEN, URINE: NEGATIVE ng/mL
Buprenorphine, Urine: POSITIVE ng/mL — AB
CANNABINOIDS UR QL SCN: NEGATIVE ng/mL
Cocaine (Metab) Scrn, Ur: NEGATIVE ng/mL
Creatinine(Crt), U: 254.2 mg/dL (ref 20.0–300.0)
Fentanyl, Urine: NEGATIVE pg/mL
Meperidine Screen, Urine: NEGATIVE ng/mL
Methadone Screen, Urine: NEGATIVE ng/mL
OXYCODONE+OXYMORPHONE UR QL SCN: NEGATIVE ng/mL
Opiate Scrn, Ur: NEGATIVE ng/mL
Ph of Urine: 6.2 (ref 4.5–8.9)
Phencyclidine Qn, Ur: NEGATIVE ng/mL
Propoxyphene Scrn, Ur: NEGATIVE ng/mL
Tramadol Screen, Urine: NEGATIVE ng/mL

## 2020-09-10 LAB — AFP, SERUM, OPEN SPINA BIFIDA
AFP MoM: 1
AFP Value: 38.1 ng/mL
Gest. Age on Collection Date: 17 weeks
Maternal Age At EDD: 25.7 yr
OSBR Risk 1 IN: 10000
Test Results:: NEGATIVE
Weight: 160 [lb_av]

## 2020-09-17 ENCOUNTER — Other Ambulatory Visit: Payer: Self-pay | Admitting: *Deleted

## 2020-09-17 ENCOUNTER — Ambulatory Visit: Payer: Medicaid Other | Admitting: *Deleted

## 2020-09-17 ENCOUNTER — Other Ambulatory Visit: Payer: Self-pay

## 2020-09-17 ENCOUNTER — Ambulatory Visit: Payer: Medicaid Other | Attending: Family Medicine

## 2020-09-17 ENCOUNTER — Encounter: Payer: Self-pay | Admitting: *Deleted

## 2020-09-17 VITALS — BP 108/63 | HR 102

## 2020-09-17 DIAGNOSIS — O99322 Drug use complicating pregnancy, second trimester: Secondary | ICD-10-CM

## 2020-09-17 DIAGNOSIS — O0990 Supervision of high risk pregnancy, unspecified, unspecified trimester: Secondary | ICD-10-CM | POA: Diagnosis present

## 2020-09-17 DIAGNOSIS — F1120 Opioid dependence, uncomplicated: Secondary | ICD-10-CM

## 2020-09-17 IMAGING — US US MFM OB DETAIL+14 WK
1 series · 12 of 28 positions shown · non-contrast
Comparison: none

[Series 1: us mfm ob detail+14 wk · 77 acquisitions, 12 frames shown]
[im 3/77]
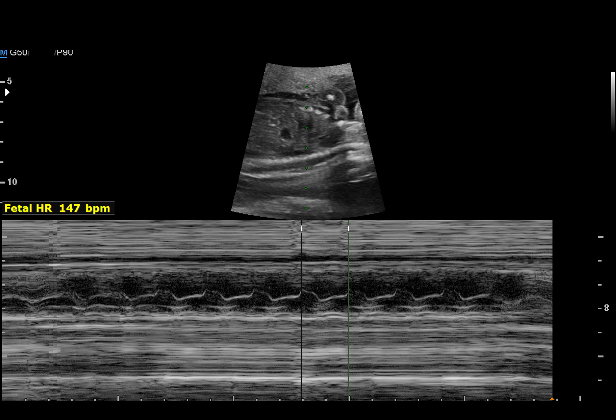
[im 9/77]
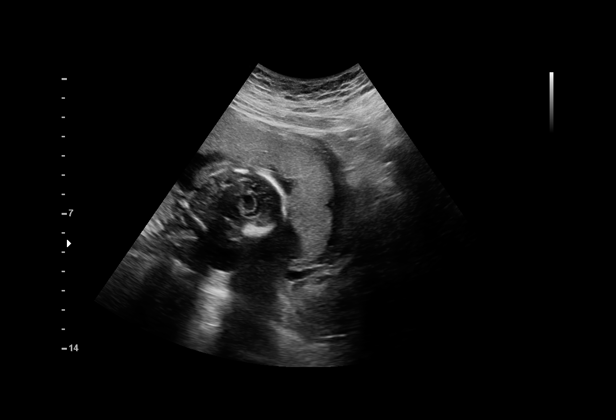
[im 15/77]
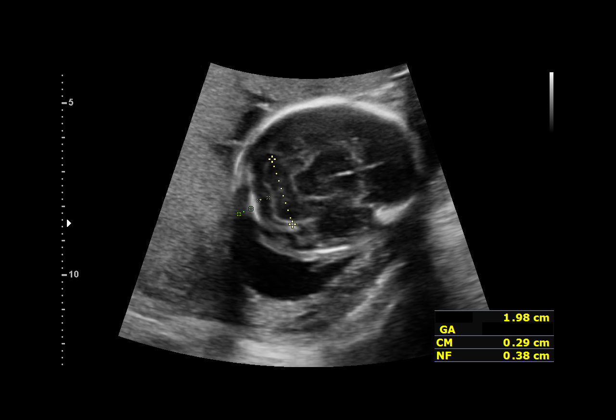
[im 23/77]
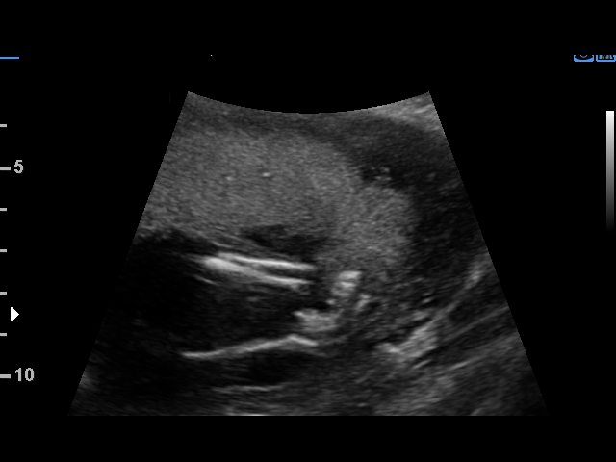
[im 29/77]
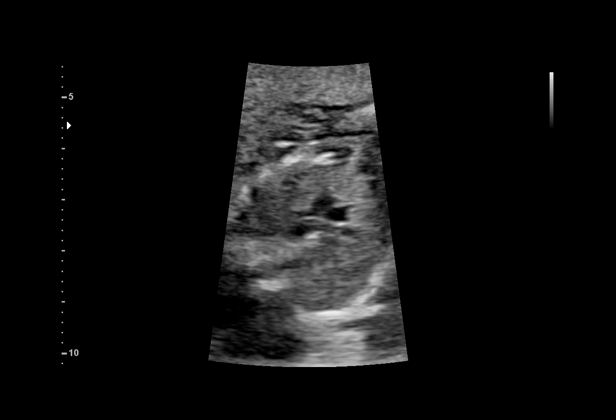
[im 34/77]
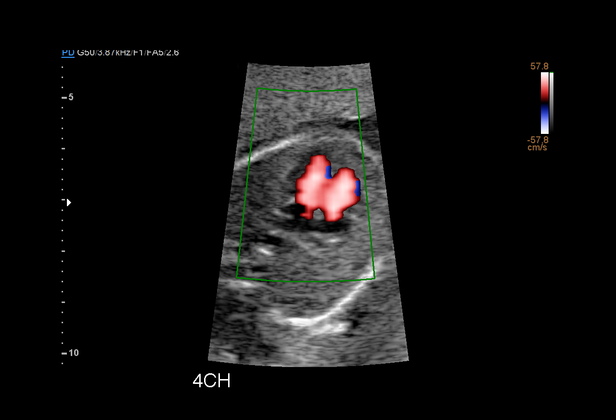
[im 43/77]
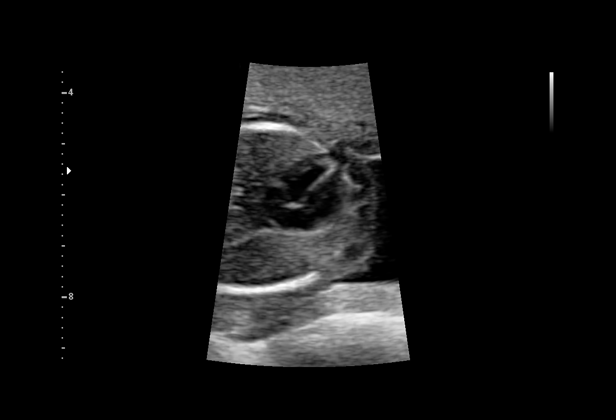
[im 48/77]
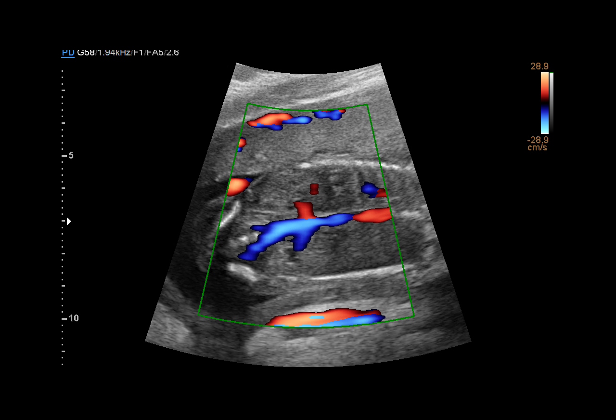
[im 54/77]
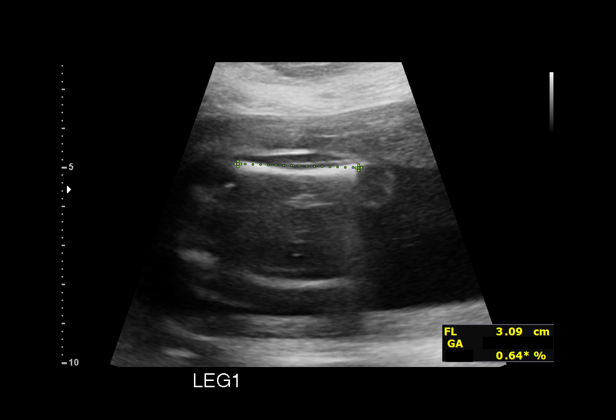
[im 62/77]
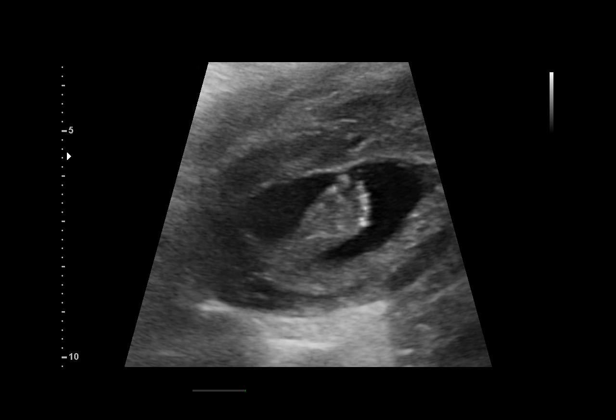
[im 68/77]
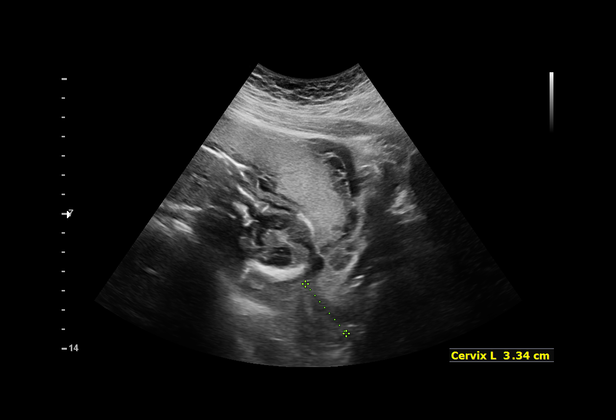
[im 74/77]
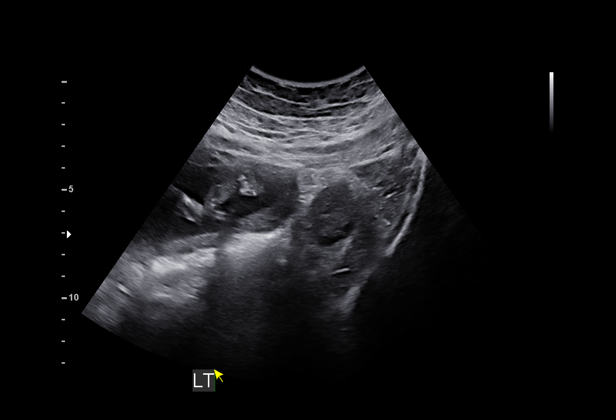

[12 of 28 positions shown; findings below may reference images not displayed]

Addendum:\.br----------------------------------------------------------------------
----------------------------------------------------------------------

----------------------------------------------------------------------

----------------------------------------------------------------------

----------------------------------------------------------------------

----------------------------------------------------------------------
Indications

 19 weeks gestation of pregnancy
 Encounter for antenatal screening for           [MD]
 malformations
 Poor obstetric history-Recurrent (habitual)     [MD]
 abortion (3 consecutive ab's)
 Suboxone use (on suboxone)                      [MD]
 LR NIPS, Neg AFP, Neg Horizon
 Low lying placenta, antepartum                  [MD]
----------------------------------------------------------------------
Fetal Evaluation

 Num Of Fetuses:          1
 Fetal Heart Rate(bpm):   147
 Cardiac Activity:        Observed
 Presentation:            Cephalic
 Placenta:                Anterior low-lying, 1.4 cm from int os
 P. Cord Insertion:       Visualized, central

 Amniotic Fluid
 AFI FV:      Within normal limits

                             Largest Pocket(cm)

----------------------------------------------------------------------
Biometry
 BPD:      41.6   mm     G. Age:  18w 4d         33  %    CI:         68.18  %    70 - 86
                                                          FL/HC:       19.7  %    16.1 -
 HC:      161.1   mm     G. Age:  18w 6d         38  %    HC/AC:       1.17       1.09 -
 AC:      137.5   mm     G. Age:  19w 1d         52  %    FL/BPD:      76.4  %
 FL:       31.8   mm     G. Age:  19w 6d         75  %    FL/AC:       23.1  %    20 - 24
 HUM:        30   mm     G. Age:  19w 6d         73  %
 CER:      19.8   mm     G. Age:  19w 1d         43  %
 NFT:        3.8  mm

 LV:         5.2  mm
 CM:         2.9  mm

 Est. FW:     292   gm    0 lb 10 oz      71  %
----------------------------------------------------------------------
OB History

 Gravidity:     4         Term:  0          SAB:   3
 Living:        0
----------------------------------------------------------------------
Gestational Age

 LMP:            22w 0d       Date:  [DATE]                   EDD:  [DATE]
 U/S Today:      19w 1d                                         EDD:  [DATE]
 Best:           19w 0d    Det. By:  Early Ultrasound           EDD:  [DATE]
                                     ([DATE])
----------------------------------------------------------------------
Anatomy

 Cranium:                Appears normal         Aortic Arch:            Previously seen
 Cavum:                  Appears normal         Ductal Arch:            Previously seen
 Ventricles:             Appears normal         Diaphragm:              Appears normal
 Choroid Plexus:         Appears normal         Stomach:                Appears normal, left
                                                                        sided
 Cerebellum:             Appears normal         Abdomen:                Appears normal
 Posterior Fossa:        Appears normal         Abdominal Wall:         Appears nml (cord
                                                                        insert, abd wall)
 Nuchal Fold:            Appears normal         Cord Vessels:           Appears normal (3
                                                                        vessel cord)
 Face:                   Appears normal         Kidneys:                Appear normal
                         (orbits and profile)
 Lips:                   Appears normal         Bladder:                Appears normal
 Thoracic:               Appears normal         Spine:                  Appears normal
 Heart:                  Not well visualized    Upper Extremities:      Appears normal
 RVOT:                   Not well visualized    Lower Extremities:      Appears normal
 LVOT:                   Not well visualized

 Other:   Heels and 5th digit visualized. VC not well seen, 3VV and 3VTV
          visualized. Fetus appears to be female.
----------------------------------------------------------------------
Cervix Uterus Adnexa

 Cervix
 Length:            3.34  cm.
 Normal appearance by transabdominal scan.
 Right Ovary
 Visualized.

 Left Ovary
 Visualized.
----------------------------------------------------------------------
Impression

 G4 P0.  Patient is here for fetal anatomy scan. On cell-free fetal
 DNA screening, the risks of aneuploidies are not increased.
 MSAFP screening showed low risk for open-neural tube
 defects.
 She is on Suboxone maintenance.
 We performed a fetal anatomy scan. No markers of aneuploidies
 or fetal structural defects are seen. Fetal biometry is consistent
 with her previously-established dates. Amniotic fluid is normal
 and good fetal activity is seen. Patient understands the
 limitations of ultrasound in detecting fetal anomalies.

 Placenta is anterior and low lying. Patient does not have
 symptoms of vaginal bleeding. I reassured that low-lying
 placenta resolves with advancing gestation.
----------------------------------------------------------------------
Recommendations

 -An appointment was made for her to return in 4 weeks for
 completion of fetal anatomy and check placental position.
----------------------------------------------------------------------
                      LUIS DONALDO
----------------------------------------------------------------------

*** End of Addendum ***\.br----------------------------------------------------------------------
----------------------------------------------------------------------

 Name:       LUIS DONALDO                     Visit Date: [DATE] [DATE]
----------------------------------------------------------------------

----------------------------------------------------------------------

----------------------------------------------------------------------

----------------------------------------------------------------------
Indications

 19 weeks gestation of pregnancy
 Encounter for antenatal screening for          [MD]
 malformations
 Poor obstetric history-Recurrent (habitual)    [MD]
 abortion (3 consecutive ab's)
 Suboxone use (on suboxone)                     [MD]
 LR NIPS, Neg AFP, Neg Horizon
 Low lying placenta, antepartum                 [MD]
----------------------------------------------------------------------
Fetal Evaluation

 Num Of Fetuses:         1
 Fetal Heart Rate(bpm):  147
 Cardiac Activity:       Observed
 Presentation:           Cephalic
 Placenta:               Anterior low-lying, 1.4 cm from int os
 P. Cord Insertion:      Visualized, central

 Amniotic Fluid
 AFI FV:      Within normal limits

                             Largest Pocket(cm)

----------------------------------------------------------------------
Biometry
 BPD:      41.6  mm     G. Age:  18w 4d         33  %    CI:        68.18   %    70 - 86
                                                         FL/HC:      19.7   %    16.1 -
 HC:      161.1  mm     G. Age:  18w 6d         38  %    HC/AC:      1.17        1.09 -
 AC:      137.5  mm     G. Age:  19w 1d         52  %    FL/BPD:     76.4   %
 FL:       31.8  mm     G. Age:  19w 6d         75  %    FL/AC:      23.1   %    20 - 24
 HUM:        30  mm     G. Age:  19w 6d         73  %
 CER:      19.8  mm     G. Age:  19w 1d         43  %
 NFT:       3.8  mm
 LV:        5.2  mm
 CM:        2.9  mm

 Est. FW:     292  gm    0 lb 10 oz      71  %
----------------------------------------------------------------------
OB History

 Gravidity:    4         Term:   0         SAB:   3
 Living:       0
----------------------------------------------------------------------
Gestational Age

 LMP:           22w 0d        Date:  [DATE]                 EDD:   [DATE]
 U/S Today:     19w 1d                                        EDD:   [DATE]
 Best:          19w 0d     Det. By:  Early Ultrasound         EDD:   [DATE]
                                     ([DATE])
----------------------------------------------------------------------
Anatomy

 Cranium:               Appears normal         Aortic Arch:            Previously seen
 Cavum:                 Appears normal         Ductal Arch:            Previously seen
 Ventricles:            Appears normal         Diaphragm:              Appears normal
 Choroid Plexus:        Appears normal         Stomach:                Appears normal, left
                                                                       sided
 Cerebellum:            Appears normal         Abdomen:                Appears normal
 Posterior Fossa:       Appears normal         Abdominal Wall:         Appears nml (cord
                                                                       insert, abd wall)
 Nuchal Fold:           Appears normal         Cord Vessels:           Appears normal (3
                                                                       vessel cord)
 Face:                  Appears normal         Kidneys:                Appear normal
                        (orbits and profile)
 Lips:                  Appears normal         Bladder:                Appears normal
 Thoracic:              Appears normal         Spine:                  Appears normal
 Heart:                 Not well visualized    Upper Extremities:      Appears normal
 RVOT:                  Not well visualized    Lower Extremities:      Appears normal
 LVOT:                  Not well visualized

 Other:  Heels and 5th digit visualized. VC not well seen, 3VV and 3VTV
         visualized. Fetus appears to be female.
----------------------------------------------------------------------
Cervix Uterus Adnexa

 Cervix
 Length:           3.34  cm.
 Normal appearance by transabdominal scan.

 Right Ovary
 Visualized.
 Left Ovary
 Visualized.
----------------------------------------------------------------------
Impression

 G4 P0.  Patient is here for fetal anatomy scan. On cell-free
 fetal DNA screening, the risks of aneuploidies are not
 increased. MSAFP screening showed low risk for open-neural
 tube defects.
 She is on Suboxone maintenance.
 We performed a fetal anatomy scan. No markers of
 aneuploidies or fetal structural defects are seen. Fetal
 biometry is consistent with her previously-established dates.
 Amniotic fluid is normal and good fetal activity is seen.
 Patient understands the limitations of ultrasound in detecting
 fetal anomalies.
----------------------------------------------------------------------
Recommendations

 -An appointment was made for her to return in 4 weeks for
 completion of fetal anatomy.
----------------------------------------------------------------------
                 LUIS DONALDO
----------------------------------------------------------------------

## 2020-09-23 ENCOUNTER — Encounter: Payer: Self-pay | Admitting: Family Medicine

## 2020-09-23 ENCOUNTER — Other Ambulatory Visit: Payer: Self-pay

## 2020-09-23 ENCOUNTER — Ambulatory Visit (INDEPENDENT_AMBULATORY_CARE_PROVIDER_SITE_OTHER): Payer: Medicaid Other | Admitting: Family Medicine

## 2020-09-23 VITALS — BP 104/60 | HR 102 | Wt 169.4 lb

## 2020-09-23 DIAGNOSIS — O4440 Low lying placenta NOS or without hemorrhage, unspecified trimester: Secondary | ICD-10-CM | POA: Insufficient documentation

## 2020-09-23 DIAGNOSIS — F1190 Opioid use, unspecified, uncomplicated: Secondary | ICD-10-CM

## 2020-09-23 DIAGNOSIS — Z2839 Other underimmunization status: Secondary | ICD-10-CM

## 2020-09-23 DIAGNOSIS — O0990 Supervision of high risk pregnancy, unspecified, unspecified trimester: Secondary | ICD-10-CM

## 2020-09-23 DIAGNOSIS — O09899 Supervision of other high risk pregnancies, unspecified trimester: Secondary | ICD-10-CM

## 2020-09-23 HISTORY — DX: Low lying placenta nos or without hemorrhage, unspecified trimester: O44.40

## 2020-09-23 MED ORDER — SUBOXONE 2-0.5 MG SL FILM: ORAL_FILM | SUBLINGUAL | 0 refills | Status: DC

## 2020-09-23 NOTE — Progress Notes (Signed)
   PRENATAL VISIT NOTE  Subjective:  Dawn Huff is a 25 y.o. G4P0030 at [redacted]w[redacted]d being seen today for ongoing prenatal care.  She is currently monitored for the following issues for this high-risk pregnancy and has Supervision of high risk pregnancy, antepartum; Opioid use disorder; Rubella non-immune status, antepartum; and Low-lying placenta on their problem list.  Patient reports no complaints and no signs of withdrawal .  Contractions: Not present. Vag. Bleeding: None.  Movement: Present. Denies leaking of fluid.   The following portions of the patient's history were reviewed and updated as appropriate: allergies, current medications, past family history, past medical history, past social history, past surgical history and problem list.   Objective:   Vitals:   09/23/20 0946  BP: 104/60  Pulse: (!) 102  Weight: 169 lb 6.4 oz (76.8 kg)    Fetal Status: Fetal Heart Rate (bpm): 146   Movement: Present     General:  Alert, oriented and cooperative. Patient is in no acute distress.  Skin: Skin is warm and dry. No rash noted.   Cardiovascular: Normal heart rate noted  Respiratory: Normal respiratory effort, no problems with respiration noted  Abdomen: Soft, gravid, appropriate for gestational age.  Pain/Pressure: Absent     Pelvic: Cervical exam deferred        Extremities: Normal range of motion.  Edema: Trace  Mental Status: Normal mood and affect. Normal behavior. Normal judgment and thought content.   Assessment and Plan:  Pregnancy: [id number] at [redacted]w[redacted]d 1. Opioid use disorder Refilled Rx today - SUBOXONE 2-0.5 MG FILM; Take 2 mg (one film) in the morning and 1 mg (half of one film) in the evening  Dispense: 23 each; Refill: 0  2. Rubella non-immune status, antepartum Will need MMR pp, discussed today  3. Low-lying placenta Discussed at length, likely to move away. Has f/u u/s scheduled  4. Supervision of high risk pregnancy, antepartum Continue prenatal care.   General  obstetric precautions including but not limited to vaginal bleeding, contractions, leaking of fluid and fetal movement were reviewed in detail with the patient. Please refer to After Visit Summary for other counseling recommendations.   Return in 2 weeks (on 10/07/2020) for [place], [place], [place] or [employer].  Future Appointments  Date Time Provider Veteran  10/08/2020  3:15 PM Clarnce Flock, MD [facility]  10/18/2020  9:15 AM [facility] NURSE [facility]  10/18/2020  9:30 AM [facility] US3 [facility]  10/29/2020  9:00 AM Cameron Sprang, MD [facility] None    Donnamae Jude, MD

## 2020-09-23 NOTE — Patient Instructions (Signed)

## 2020-10-08 ENCOUNTER — Other Ambulatory Visit: Payer: Self-pay

## 2020-10-08 ENCOUNTER — Ambulatory Visit (INDEPENDENT_AMBULATORY_CARE_PROVIDER_SITE_OTHER): Payer: Medicaid Other | Admitting: Family Medicine

## 2020-10-08 ENCOUNTER — Encounter: Payer: Self-pay | Admitting: Family Medicine

## 2020-10-08 VITALS — BP 112/70 | HR 118 | Wt 175.5 lb

## 2020-10-08 DIAGNOSIS — O4440 Low lying placenta NOS or without hemorrhage, unspecified trimester: Secondary | ICD-10-CM

## 2020-10-08 DIAGNOSIS — O0990 Supervision of high risk pregnancy, unspecified, unspecified trimester: Secondary | ICD-10-CM

## 2020-10-08 DIAGNOSIS — F1190 Opioid use, unspecified, uncomplicated: Secondary | ICD-10-CM

## 2020-10-08 DIAGNOSIS — Z2839 Other underimmunization status: Secondary | ICD-10-CM

## 2020-10-08 MED ORDER — CYCLOBENZAPRINE HCL 10 MG PO TABS: 10.0000 mg | ORAL_TABLET | Freq: Three times a day (TID) | ORAL | 1 refills | Status: DC | PRN

## 2020-10-08 MED ORDER — SUBOXONE 2-0.5 MG SL FILM: ORAL_FILM | SUBLINGUAL | 0 refills | Status: DC

## 2020-10-08 MED ORDER — PROMETHAZINE HCL 25 MG PO TABS: 25.0000 mg | ORAL_TABLET | Freq: Four times a day (QID) | ORAL | 2 refills | Status: DC | PRN

## 2020-10-08 NOTE — Progress Notes (Signed)
   Subjective:  Dawn Huff is a 25 y.o. G4P0030 at [redacted]w[redacted]d being seen today for ongoing prenatal care.  She is currently monitored for the following issues for this high-risk pregnancy and has Supervision of high risk pregnancy, antepartum; Opioid use disorder; Rubella non-immune status, antepartum; and Low-lying placenta on their problem list.  Patient reports  ankle swelling . Having lots of migraines though not currently, takes Aleve intermittently. Contractions: Not present. Vag. Bleeding: None.  Movement: Present. Denies leaking of fluid.   The following portions of the patient's history were reviewed and updated as appropriate: allergies, current medications, past family history, past medical history, past social history, past surgical history and problem list. Problem list updated.  Objective:   Vitals:   10/08/20 1541  BP: 112/70  Pulse: (!) 118  Weight: 175 lb 8 oz (79.6 kg)    Fetal Status: Fetal Heart Rate (bpm): 161   Movement: Present     General:  Alert, oriented and cooperative. Patient is in no acute distress.  Skin: Skin is warm and dry. No rash noted.   Cardiovascular: Normal heart rate noted  Respiratory: Normal respiratory effort, no problems with respiration noted  Abdomen: Soft, gravid, appropriate for gestational age. Pain/Pressure: Absent     Pelvic: Vag. Bleeding: None     Cervical exam deferred        Extremities: Normal range of motion.  Edema: Trace  Mental Status: Normal mood and affect. Normal behavior. Normal judgment and thought content.   Urinalysis:      Assessment and Plan:  Pregnancy: G4P0030 at [redacted]w[redacted]d  1. Supervision of high risk pregnancy, antepartum BP and FHR normal Trial flexeril for headaches, discussed that NSAIDs are contraindicated in pregnancy  2. Opioid use disorder Stable on $RemoveB'2mg'zfTCMvqS$  BID Last UDS appropriate, repeat sent Refill sent  3. Low-lying placenta Discussed will likely resolve  4. Rubella non-immune status, antepartum MMR  PP  Preterm labor symptoms and general obstetric precautions including but not limited to vaginal bleeding, contractions, leaking of fluid and fetal movement were reviewed in detail with the patient. Please refer to After Visit Summary for other counseling recommendations.  Return in 4 weeks (on 11/05/2020) for [facility], ob visit.   Clarnce Flock, MD

## 2020-10-08 NOTE — Patient Instructions (Signed)

## 2020-10-09 ENCOUNTER — Other Ambulatory Visit: Payer: Self-pay

## 2020-10-09 LAB — PAIN MGT SCRN (14 DRUGS), UR
Amphetamine Scrn, Ur: NEGATIVE ng/mL
BARBITURATE SCREEN URINE: NEGATIVE ng/mL
BENZODIAZEPINE SCREEN, URINE: NEGATIVE ng/mL
Buprenorphine, Urine: POSITIVE ng/mL — AB
CANNABINOIDS UR QL SCN: NEGATIVE ng/mL
Cocaine (Metab) Scrn, Ur: NEGATIVE ng/mL
Creatinine(Crt), U: 129.8 mg/dL (ref 20.0–300.0)
Fentanyl, Urine: NEGATIVE pg/mL
Meperidine Screen, Urine: NEGATIVE ng/mL
Methadone Screen, Urine: NEGATIVE ng/mL
OXYCODONE+OXYMORPHONE UR QL SCN: NEGATIVE ng/mL
Opiate Scrn, Ur: NEGATIVE ng/mL
Ph of Urine: 6.3 (ref 4.5–8.9)
Phencyclidine Qn, Ur: NEGATIVE ng/mL
Propoxyphene Scrn, Ur: NEGATIVE ng/mL
Tramadol Screen, Urine: NEGATIVE ng/mL

## 2020-10-09 MED ORDER — MEDICAL COMPRESSION SOCKS MISC: 1.0000 | 0 refills | Status: DC | PRN

## 2020-10-18 ENCOUNTER — Ambulatory Visit: Payer: Medicaid Other | Admitting: *Deleted

## 2020-10-18 ENCOUNTER — Other Ambulatory Visit: Payer: Self-pay

## 2020-10-18 ENCOUNTER — Other Ambulatory Visit: Payer: Self-pay | Admitting: *Deleted

## 2020-10-18 ENCOUNTER — Ambulatory Visit: Payer: Medicaid Other | Attending: Obstetrics and Gynecology

## 2020-10-18 ENCOUNTER — Encounter: Payer: Self-pay | Admitting: *Deleted

## 2020-10-18 VITALS — BP 113/51 | HR 109

## 2020-10-18 DIAGNOSIS — O4440 Low lying placenta NOS or without hemorrhage, unspecified trimester: Secondary | ICD-10-CM

## 2020-10-18 DIAGNOSIS — O99320 Drug use complicating pregnancy, unspecified trimester: Secondary | ICD-10-CM | POA: Diagnosis present

## 2020-10-18 DIAGNOSIS — Z362 Encounter for other antenatal screening follow-up: Secondary | ICD-10-CM | POA: Diagnosis not present

## 2020-10-18 DIAGNOSIS — Z3689 Encounter for other specified antenatal screening: Secondary | ICD-10-CM

## 2020-10-18 DIAGNOSIS — F1120 Opioid dependence, uncomplicated: Secondary | ICD-10-CM | POA: Diagnosis present

## 2020-10-18 DIAGNOSIS — Z3A23 23 weeks gestation of pregnancy: Secondary | ICD-10-CM

## 2020-10-18 DIAGNOSIS — O4442 Low lying placenta NOS or without hemorrhage, second trimester: Secondary | ICD-10-CM | POA: Diagnosis not present

## 2020-10-18 DIAGNOSIS — O99322 Drug use complicating pregnancy, second trimester: Secondary | ICD-10-CM

## 2020-10-18 DIAGNOSIS — O2622 Pregnancy care for patient with recurrent pregnancy loss, second trimester: Secondary | ICD-10-CM

## 2020-10-18 DIAGNOSIS — F1910 Other psychoactive substance abuse, uncomplicated: Secondary | ICD-10-CM

## 2020-10-18 IMAGING — US US MFM OB FOLLOW-UP
1 series · 14 of 28 positions shown · non-contrast
Comparison: none

[Series 1: us mfm ob follow-up · 75 acquisitions, 14 frames shown]
[im 3/75]
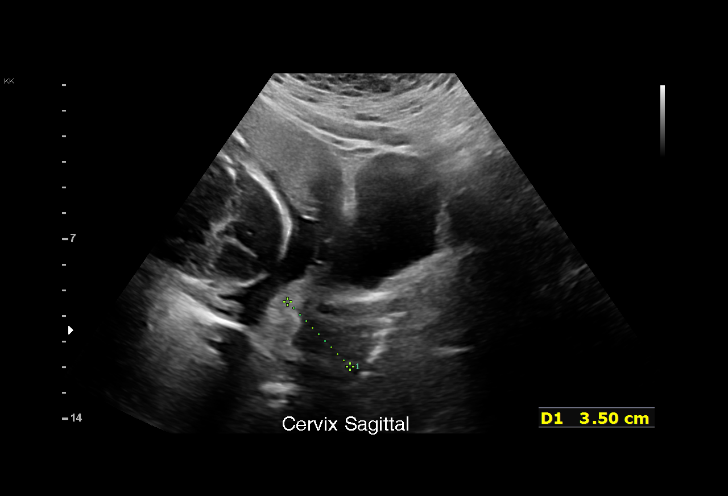
[im 9/75]
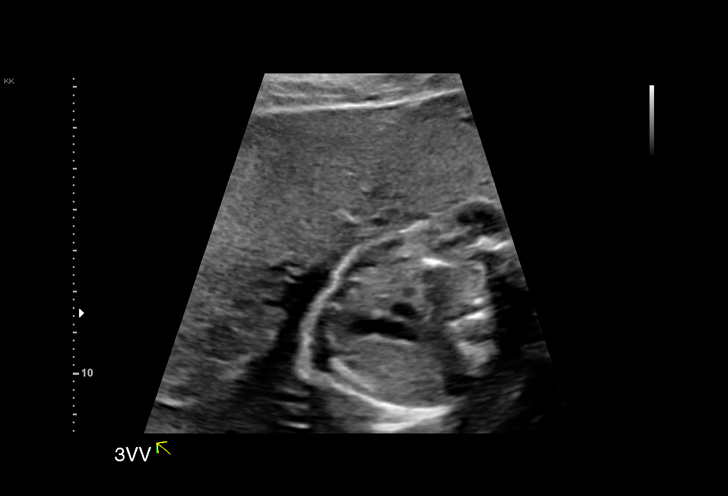
[im 14/75]
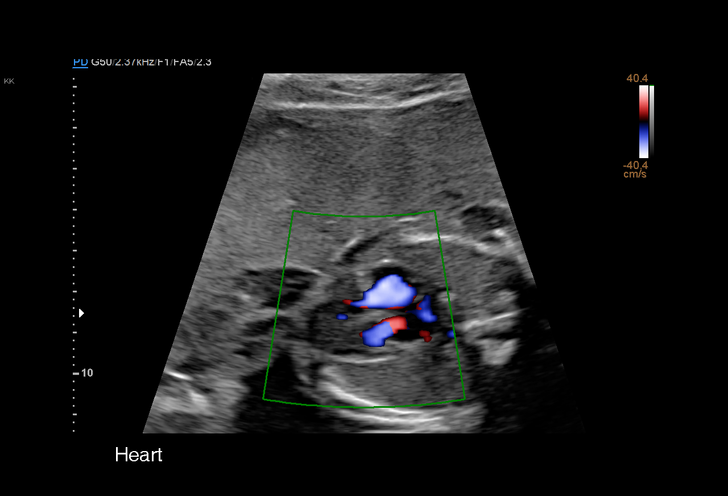
[im 20/75]
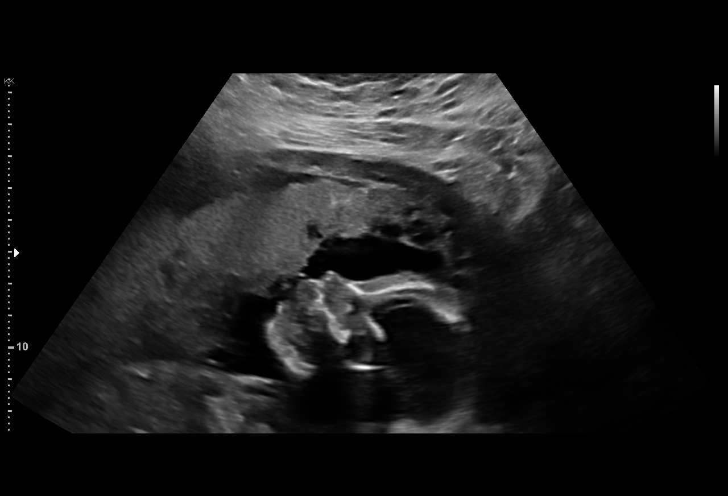
[im 25/75]
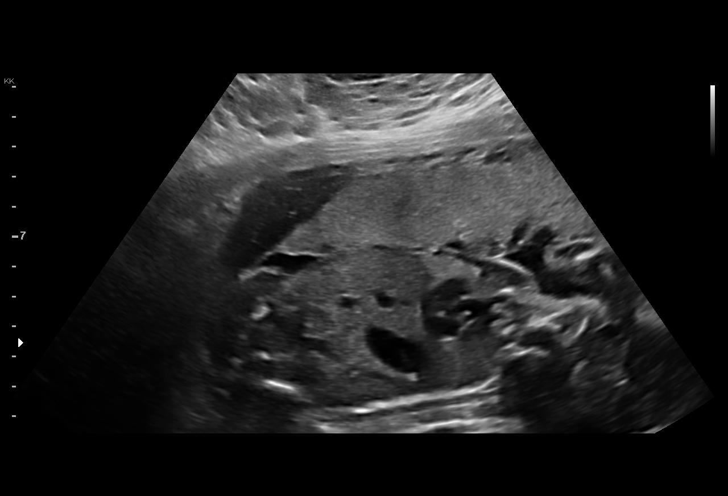
[im 31/75]
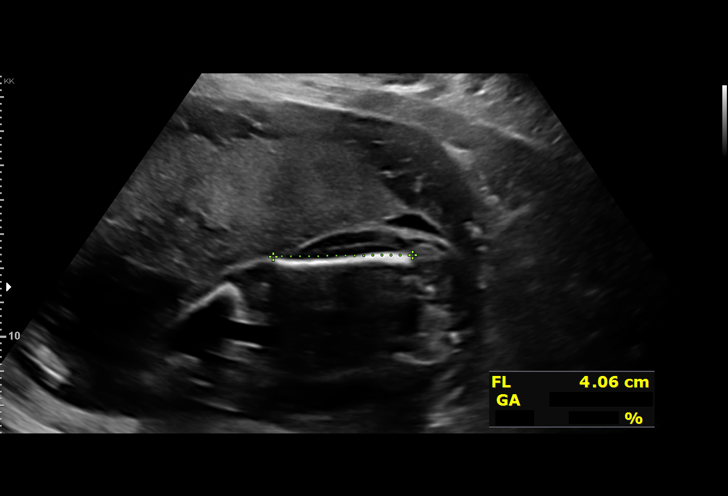
[im 36/75]
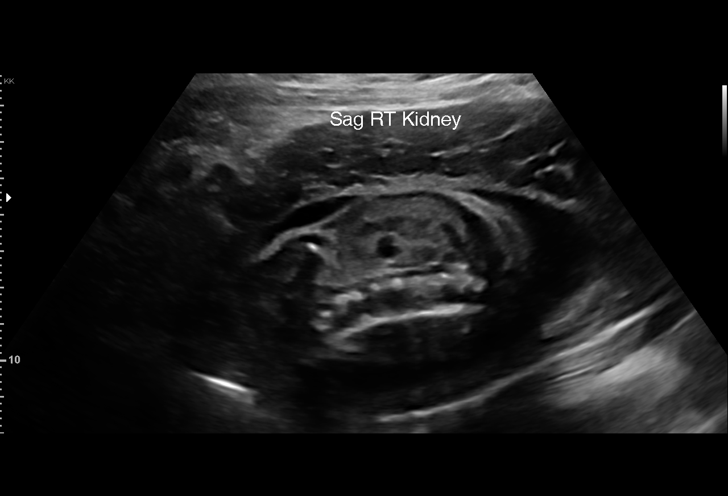
[im 42/75]
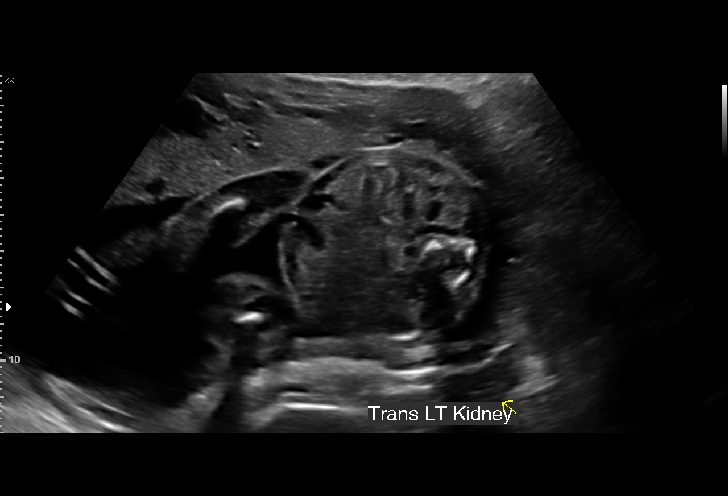
[im 47/75]
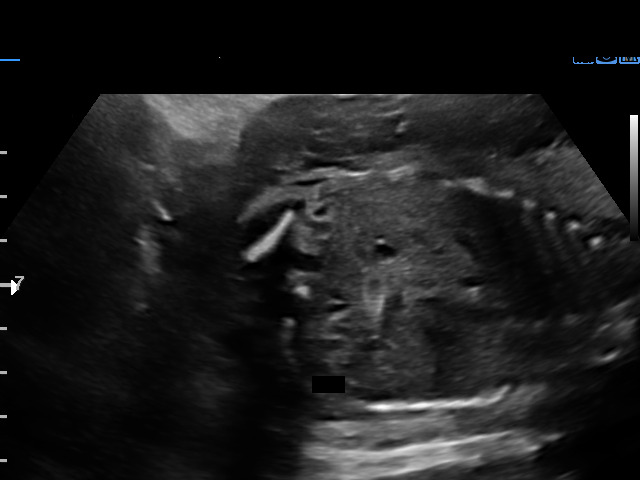
[im 53/75]
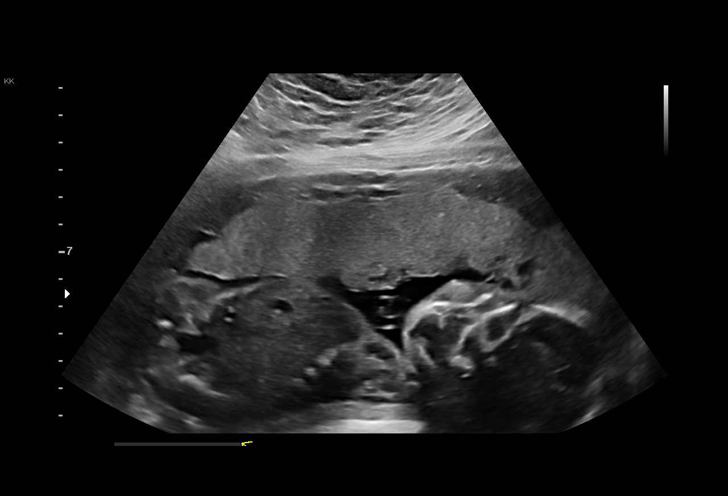
[im 58/75]
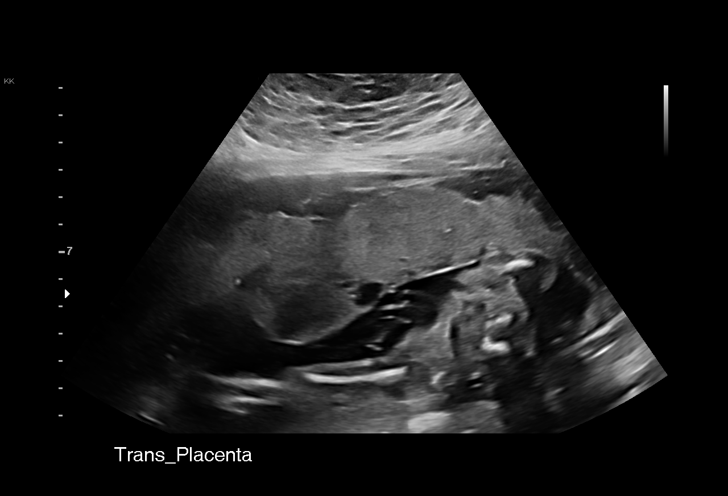
[im 64/75]
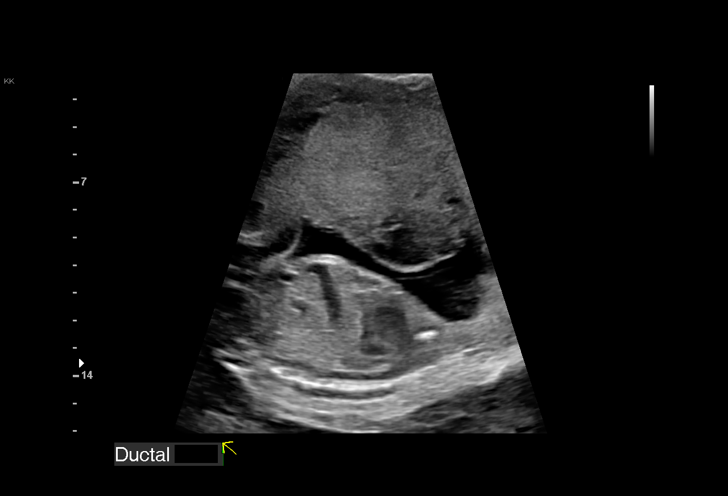
[im 69/75]
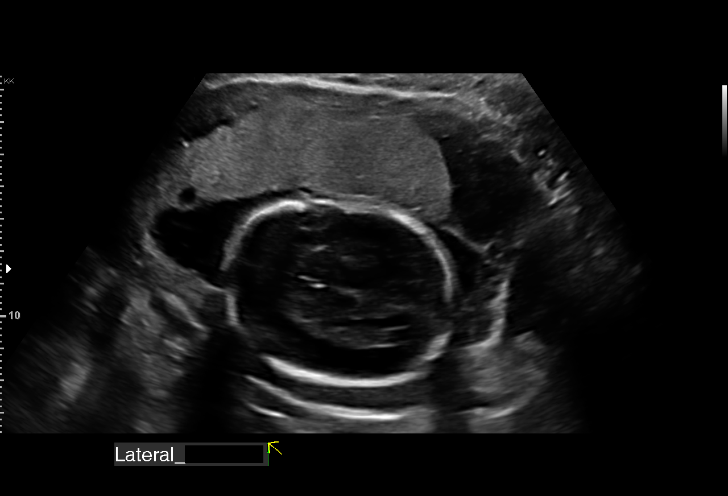
[im 75/75]
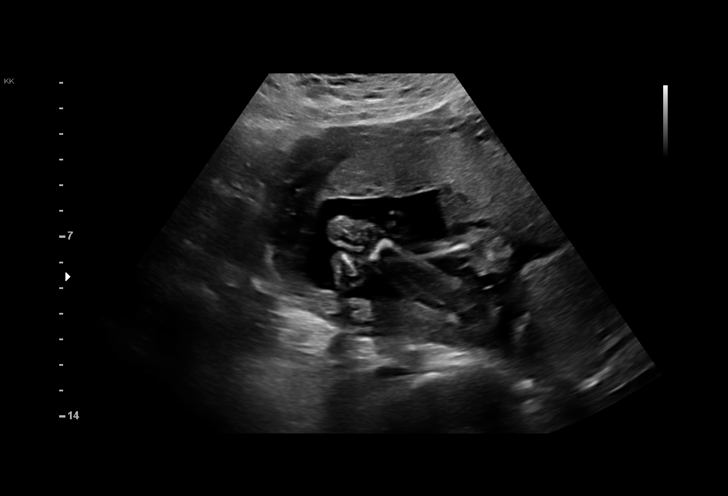

[14 of 28 positions shown; findings below may reference images not displayed]

Indications

 23 weeks gestation of pregnancy
 Poor obstetric history-Recurrent (habitual)    [P1]
 abortion (3 consecutive ab's)
 Suboxone use (on suboxone)                     [P1]
 LR NIPS, Neg AFP, Neg Horizon
 Low lying placenta, antepartum (Resolved)      [P1]
 Antenatal follow-up for nonvisualized fetal    [P1]
 anatomy
Fetal Evaluation

 Num Of Fetuses:         1
 Fetal Heart Rate(bpm):  158
 Cardiac Activity:       Observed
 Presentation:           Cephalic
 Placenta:               Anterior
 P. Cord Insertion:      Previously Visualized

 Amniotic Fluid
 AFI FV:      Within normal limits

                             Largest Pocket(cm)

Biometry
 BPD:      53.9  mm     G. Age:  22w 3d         12  %    CI:        70.07   %    70 - 86
                                                         FL/HC:      19.6   %    19.2 -
 HC:      205.4  mm     G. Age:  22w 5d         11  %    HC/AC:      1.10        1.05 -
 AC:      186.9  mm     G. Age:  23w 3d         43  %    FL/BPD:     74.8   %    71 - 87
 FL:       40.3  mm     G. Age:  23w 0d         25  %    FL/AC:      21.6   %    20 - 24

 Est. FW:     569  gm      1 lb 4 oz     30  %
OB History

 Gravidity:    4         Term:   0         SAB:   3
 Living:       0
Gestational Age

 LMP:           26w 3d        Date:  [DATE]                 EDD:   [DATE]
 U/S Today:     22w 6d                                        EDD:   [DATE]
 Best:          23w 3d     Det. By:  Early Ultrasound         EDD:   [DATE]
                                     ([DATE])
Anatomy

 Cranium:               Appears normal         Aortic Arch:            Previously seen
 Cavum:                 Previously seen        Ductal Arch:            Previously seen
 Ventricles:            Previously seen        Diaphragm:              Appears normal
 Choroid Plexus:        Previously seen        Stomach:                Appears normal, left
                                                                       sided
 Cerebellum:            Previously seen        Abdomen:                Appears normal
 Posterior Fossa:       Previously seen        Abdominal Wall:         Previously seen
 Nuchal Fold:           Previously seen        Cord Vessels:           Previously seen
 Face:                  Orbits and profile     Kidneys:                Appear normal
                        previously seen
 Lips:                  Previously seen        Bladder:                Appears normal
 Thoracic:              Appears normal         Spine:                  Previously seen
 Heart:                 Not well visualized    Upper Extremities:      Previously seen
 RVOT:                  Not well visualized    Lower Extremities:      Previously seen
 LVOT:                  Not well visualized

 Other:  Heels, 5th digit, 3VV and 3VTV previously visualized. Fetus appears
         to be female.
Targeted Anatomy

 Thorax
 3 Vessel View:         Appears normal         3 V Trachea View:       Appears normal
Cervix Uterus Adnexa

 Cervix
 Normal appearance by transabdominal scan.
Impression

 Follow up growth due to complete fetal antomy and to
 evaluate the placental edge.
 Normal interval growth with measurements consistent with
 dates
 Good fetal movement and amniotic fluid volume

 Low lying placenta is now resolved.

 Suboptimal views of the fetal anatomy were obtained
 secondary to fetal position.
Recommendations

 Follow up growth scheduled in 4-6 weeks.

## 2020-10-22 ENCOUNTER — Encounter: Payer: Self-pay | Admitting: Family Medicine

## 2020-10-22 ENCOUNTER — Telehealth (INDEPENDENT_AMBULATORY_CARE_PROVIDER_SITE_OTHER): Payer: Medicaid Other | Admitting: Family Medicine

## 2020-10-22 VITALS — BP 109/77 | HR 94

## 2020-10-22 DIAGNOSIS — O99322 Drug use complicating pregnancy, second trimester: Secondary | ICD-10-CM

## 2020-10-22 DIAGNOSIS — F1190 Opioid use, unspecified, uncomplicated: Secondary | ICD-10-CM

## 2020-10-22 DIAGNOSIS — Z3A24 24 weeks gestation of pregnancy: Secondary | ICD-10-CM

## 2020-10-22 DIAGNOSIS — K219 Gastro-esophageal reflux disease without esophagitis: Secondary | ICD-10-CM

## 2020-10-22 DIAGNOSIS — O09899 Supervision of other high risk pregnancies, unspecified trimester: Secondary | ICD-10-CM

## 2020-10-22 DIAGNOSIS — O4442 Low lying placenta NOS or without hemorrhage, second trimester: Secondary | ICD-10-CM

## 2020-10-22 DIAGNOSIS — Z2839 Other underimmunization status: Secondary | ICD-10-CM

## 2020-10-22 DIAGNOSIS — O4440 Low lying placenta NOS or without hemorrhage, unspecified trimester: Secondary | ICD-10-CM

## 2020-10-22 DIAGNOSIS — O0990 Supervision of high risk pregnancy, unspecified, unspecified trimester: Secondary | ICD-10-CM

## 2020-10-22 MED ORDER — PANTOPRAZOLE SODIUM 20 MG PO TBEC: 20.0000 mg | DELAYED_RELEASE_TABLET | Freq: Every day | ORAL | 5 refills | Status: DC

## 2020-10-22 MED ORDER — SUBOXONE 2-0.5 MG SL FILM: ORAL_FILM | SUBLINGUAL | 0 refills | Status: DC

## 2020-10-22 NOTE — Progress Notes (Signed)
I connected with Dawn Huff 10/22/20 at  3:15 PM EDT by: MyChart video and verified that I am speaking with the correct person using two identifiers.  Patient is located at home and provider is located at [facility].     The purpose of this virtual visit is to provide medical care while limiting exposure to the novel coronavirus. I discussed the limitations, risks, security and privacy concerns of performing an evaluation and management service by MyChart video and the availability of in person appointments. I also discussed with the patient that there may be a patient responsible charge related to this service. By engaging in this virtual visit, you consent to the provision of healthcare.  Additionally, you authorize for your insurance to be billed for the services provided during this visit.  The patient expressed understanding and agreed to proceed.  The following staff members participated in the virtual visit:  Clarnce Flock MD/MPH    PRENATAL VISIT NOTE  Subjective:  Dawn Huff is a 25 y.o. G4P0030 at [redacted]w[redacted]d for phone visit for ongoing prenatal care.  She is currently monitored for the following issues for this high-risk pregnancy and has Supervision of high risk pregnancy, antepartum; Opioid use disorder; and Rubella non-immune status, antepartum on their problem list.  Patient reports heartburn.  Contractions: Not present. Vag. Bleeding: None.  Movement: Present. Denies leaking of fluid.   The following portions of the patient's history were reviewed and updated as appropriate: allergies, current medications, past family history, past medical history, past social history, past surgical history and problem list.   Objective:   Vitals:   10/22/20 1331  BP: 109/77  Pulse: 94   Self-Obtained  Fetal Status:     Movement: Present     Assessment and Plan:  Pregnancy: G4P0030 at 230w0d. Low-lying placenta Resolved on growth scan last week  2. Supervision of high risk  pregnancy, antepartum BP normal Good fetal movement Trial PPI for heartburn  3. Opioid use disorder Stable on 69m35mAM, 1mg18mM No dose adjustment Last UDS appropriate Repeat at next visit Refill sent  4. Rubella non-immune status, antepartum Offer MMR PP  Preterm labor symptoms and general obstetric precautions including but not limited to vaginal bleeding, contractions, leaking of fluid and fetal movement were reviewed in detail with the patient.  Return in about 2 weeks (around 11/05/2020) [facility].  Future Appointments  Date Time Provider Depa[place]26/2022  3:15 PM EcksClarnce Flock [facility]2/2022  9:00 AM AquiCameron Sprang [facility] None  11/12/2020  8:15 AM EcksClarnce Flock [facility]16/2022  8:50 AM [facility] LAB [facility]2/2022  2:30 PM [facility] NURSE [facility]2/2022  2:45 PM [facility] US5 [facility] [place] Time spent on virtual visit: 10 minutes  MattClarnce Flock

## 2020-10-22 NOTE — Progress Notes (Signed)
I connected with  Dawn Huff on 10/22/20 at  3:15 PM EDT by MyChart Virtual Video Visit and verified that I am speaking with the correct person using two identifiers.   I discussed the limitations, risks, security and privacy concerns of performing an evaluation and management service by telephone and the availability of in person appointments. I also discussed with the patient that there may be a patient responsible charge related to this service. The patient expressed understanding and agreed to proceed.  Guy Begin, CMA 10/22/2020  1:28 PM

## 2020-10-29 ENCOUNTER — Other Ambulatory Visit: Payer: Self-pay | Admitting: Family Medicine

## 2020-10-29 ENCOUNTER — Encounter: Payer: Self-pay | Admitting: Neurology

## 2020-10-29 ENCOUNTER — Other Ambulatory Visit: Payer: Self-pay

## 2020-10-29 ENCOUNTER — Ambulatory Visit (INDEPENDENT_AMBULATORY_CARE_PROVIDER_SITE_OTHER): Payer: Medicaid Other | Admitting: Neurology

## 2020-10-29 VITALS — BP 107/68 | HR 119 | Ht 60.25 in | Wt 187.8 lb

## 2020-10-29 DIAGNOSIS — F1190 Opioid use, unspecified, uncomplicated: Secondary | ICD-10-CM

## 2020-10-29 DIAGNOSIS — R4020 Unspecified coma: Secondary | ICD-10-CM | POA: Diagnosis not present

## 2020-10-29 DIAGNOSIS — R519 Headache, unspecified: Secondary | ICD-10-CM

## 2020-10-29 DIAGNOSIS — R531 Weakness: Secondary | ICD-10-CM

## 2020-10-29 MED ORDER — SUBOXONE 2-0.5 MG SL FILM: ORAL_FILM | SUBLINGUAL | 0 refills | Status: DC

## 2020-10-29 NOTE — Progress Notes (Signed)
NEUROLOGY CONSULTATION NOTE  Dawn Huff MRN: [id number] DOB: 1995/08/07  Referring provider: Dr. Emeterio Reeve Primary care provider: none listed  Reason for consult:  seizures  Dear Dr Roselie Awkward:  Thank you for your kind referral of Dawn Huff for consultation of the above symptoms. Although her history is well known to you, please allow me to reiterate it for the purpose of our medical record. She is alone in the office today. Records and images were personally reviewed where available.   HISTORY OF PRESENT ILLNESS: This is a 25 year old right-handed G4P0030 woman at 75 weeks of gestation presenting for evaluation of possible seizures. She is due on February 11, 2021. She had a syncopal episode at age 78, she thinks she fainted but her mother told her it was a seizure. She was event-free for many years and states that when she was on drugs, she had seizures that she thought were drug-induced. She would wake up in the center of the bed in a fetal position with her hand cramped and jaw tight, with urinary incontinence. Her friend also told her of episodes 4 years ago when she would have spells while on methamphetamine where the right side of her face would droop and she would be unable to talk or remember her friend's name. She states she lost awareness during them. When her friend tested, her, she would be unable to lift the right arm. These lasted a few minutes, none in the past 4 years. When she found out she was pregnant, she stopped heroin and switched to Suboxone. She had gotten off drugs then on 06/12/20 she did not feel well and leaned on the counter. Her father said she grabbed the cabinet and started shaking, he lowered her to the ground. She went to the ER reporting right knee pain, dizziness when she stood up, vision became blurred and she blacked out, father lowered her to the ground. There is no mention of seizure in the ER notes, she was diagnosed with syncope. She had another episode  in April or May where she did not feel got, went to the toilet, and recalls getting up, then waking up on the floor on her knees with fists clenched. No tongue bite or incontinence. She lives with her mother. She denies any staring/unresponsiveness. She denies any olfactory/gustatory hallucinations, myoclonic jerks. For the past 6 months, her right side (arm and leg) feel weaker and numb usually when she wakes up. She has occasional neck pain. She has a history of migraines occurring 1-2 times a month prior to pregnancy, however over the past 6 months she has had daily headaches more intense than her usual migraines. Pain is constant over the frontal and occipital regions with associated nausea, photo/phonophobia. She has Flexeril to take prn, it makes her go to sleep. She does not take any other prn medication. Her maternal grandmother and cousins have migraines. She feels dizzy when standing/walking sometimes, no falls. Memory is pretty bad. She had a normal birth and early development.  There is no history of febrile convulsions, CNS infections such as meningitis/encephalitis, significant traumatic brain injury, neurosurgical procedures, or family history of seizures.   PAST MEDICAL HISTORY: Past Medical History:  Diagnosis Date   ADHD (attention deficit hyperactivity disorder)    Contraceptive management 03/13/2013   IBS (irritable bowel syndrome)    Irregular bleeding 03/10/2013   Lactose intolerance    Low-lying placenta 09/23/2020   1.4 cm from os on initial anatomy scan Resolved 10/18/20  PAST SURGICAL HISTORY: History reviewed. No pertinent surgical history.  MEDICATIONS: Current Outpatient Medications on File Prior to Visit  Medication Sig Dispense Refill   Blood Pressure Monitoring (BLOOD PRESSURE KIT) DEVI 1 Device by Does not apply route as needed. 1 each 0   cyclobenzaprine (FLEXERIL) 10 MG tablet Take 1 tablet (10 mg total) by mouth every 8 (eight) hours as needed for muscle  spasms. 30 tablet 1   Elastic Bandages & Supports (MEDICAL COMPRESSION SOCKS) MISC 1 each by Does not apply route as needed. (Patient not taking: Reported on 10/22/2020) 1 each 0   pantoprazole (PROTONIX) 20 MG tablet Take 1 tablet (20 mg total) by mouth daily. 30 tablet 5   polyethylene glycol powder (GLYCOLAX/MIRALAX) 17 GM/SCOOP powder Take 17 g by mouth daily as needed. 510 g 1   Prenatal Vit-Fe Fumarate-FA (M-NATAL PLUS) 27-1 MG TABS Take 1 tablet by mouth daily.     promethazine (PHENERGAN) 25 MG tablet Take 1 tablet (25 mg total) by mouth every 6 (six) hours as needed for nausea or vomiting. 30 tablet 2   SUBOXONE 2-0.5 MG FILM Take 2 mg (one film) in the morning and 1 mg (half of one film) in the evening 24 each 0   No current facility-administered medications on file prior to visit.    ALLERGIES: No Known Allergies  FAMILY HISTORY: Family History  Problem Relation Age of Onset   Hypertension Maternal Grandmother    Diabetes Maternal Grandmother     SOCIAL HISTORY: Social History   Socioeconomic History   Marital status: Single    Spouse name: Not on file   Number of children: Not on file   Years of education: Not on file   Highest education level: Not on file  Occupational History   Not on file  Tobacco Use   Smoking status: Every Day    Packs/day: 0.50    Years: 6.00    Pack years: 3.00    Types: Cigarettes   Smokeless tobacco: Never  Vaping Use   Vaping Use: Every day   Substances: Nicotine-salt  Substance and Sexual Activity   Alcohol use: Not Currently    Comment: occ   Drug use: Not Currently    Types: Marijuana, Heroin    Comment: twice daily   Sexual activity: Yes    Birth control/protection: None, Condom  Other Topics Concern   Not on file  Social History Narrative   Right handed    Social Determinants of Health   Financial Resource Strain: Not on file  Food Insecurity: Food Insecurity Present   Worried About Running Out of Food in the Last  Year: Sometimes true   Ran Out of Food in the Last Year: Sometimes true  Transportation Needs: Unmet Transportation Needs   Lack of Transportation (Medical): Yes   Lack of Transportation (Non-Medical): No  Physical Activity: Not on file  Stress: Not on file  Social Connections: Not on file  Intimate Partner Violence: Not on file     PHYSICAL EXAM: Vitals:   10/29/20 0901  BP: 107/68  Pulse: (!) 119  SpO2: 97%   General: No acute distress Head:  Normocephalic/atraumatic Skin/Extremities: No rash, no edema Neurological Exam: Mental status: alert and oriented to person, place, and time, no dysarthria or aphasia, Fund of knowledge is appropriate.  Recent and remote memory are intact, 3/3 delayed recall.  Attention and concentration are normal, 5/5 WORLD backward. Cranial nerves: CN I: not tested CN II: pupils equal, round and  reactive to light, visual fields intact CN III, IV, VI:  full range of motion, no nystagmus, no ptosis CN V: facial sensation intact CN VII: upper and lower face symmetric CN VIII: hearing intact to conversation Bulk & Tone: normal, no fasciculations. Motor: 5/5 throughout with no pronator drift. Sensation: intact to light touch, cold, pin, vibration and joint position sense.  No extinction to double simultaneous stimulation.  Romberg test negative Deep Tendon Reflexes: +2 throughout Cerebellar: no incoordination on finger to nose testing Gait: narrow-based and steady, able to tandem walk adequately. Tremor: none   IMPRESSION: This is a 25 year old right-handed G4P0030 woman at 25 weeks of gestation presenting for evaluation of possible seizures. The 2 episodes she describes in March and April are possibly vasovagal syncope, however she reports episodes of right-sided weakness and inability to speak (none in 4 years), as well as waking up with right-sided numbness/weakness. MRI brain without contrast and 1-hour EEG will be ordered as part of seizure work-up.  She has also been having daily headaches since pregnancy started, MRV without contrast will be ordered to rule out cerebral venous thrombosis.  [place] driving laws were discussed with the patient, and she knows to stop driving after a seizure, until 6 months seizure-free. Follow-up after tests, call for any changes.    Thank you for allowing me to participate in the care of this patient. Please do not hesitate to call for any questions or concerns.   Ellouise Newer, M.D.  CC: Dr. Roselie Awkward

## 2020-10-29 NOTE — Progress Notes (Signed)
Refill for suboxone as patient going out of town and had to reschedule clinic visit for one week later

## 2020-10-29 NOTE — Patient Instructions (Signed)
Schedule MRI brain without contrast, MRV head without contrast  2. Schedule 1-hour EEG  3. Follow-up after tests, call for any changes   Seizure Precautions: 1. If medication has been prescribed for you to prevent seizures, take it exactly as directed.  Do not stop taking the medicine without talking to your doctor first, even if you have not had a seizure in a long time.   2. Avoid activities in which a seizure would cause danger to yourself or to others.  Don't operate dangerous machinery, swim alone, or climb in high or dangerous places, such as on ladders, roofs, or girders.  Do not drive unless your doctor says you may.  3. If you have any warning that you may have a seizure, lay down in a safe place where you can't hurt yourself.    4.  No driving for 6 months from last seizure, as per [facility].   Please refer to the following link on the [employer]'s website for more information: http://www.epilepsyfoundation.org/answerplace/Social/driving/drivingu.cfm   5.  Maintain good sleep hygiene. Avoid alcohol.  6.  Notify your neurology if you are planning pregnancy or if you become pregnant.  7.  Contact your doctor if you have any problems that may be related to the medicine you are taking.  8.  Call 911 and bring the patient back to the ED if:        A.  The seizure lasts longer than 5 minutes.       B.  The patient doesn't awaken shortly after the seizure  C.  The patient has new problems such as difficulty seeing, speaking or moving  D.  The patient was injured during the seizure  E.  The patient has a temperature over 102 F (39C)  F.  The patient vomited and now is having trouble breathing

## 2020-11-11 ENCOUNTER — Ambulatory Visit (INDEPENDENT_AMBULATORY_CARE_PROVIDER_SITE_OTHER): Payer: Medicaid Other | Admitting: Neurology

## 2020-11-11 ENCOUNTER — Other Ambulatory Visit: Payer: Self-pay

## 2020-11-11 DIAGNOSIS — R4020 Unspecified coma: Secondary | ICD-10-CM

## 2020-11-11 DIAGNOSIS — R519 Headache, unspecified: Secondary | ICD-10-CM

## 2020-11-11 DIAGNOSIS — R531 Weakness: Secondary | ICD-10-CM | POA: Diagnosis not present

## 2020-11-12 ENCOUNTER — Other Ambulatory Visit: Payer: Self-pay | Admitting: General Practice

## 2020-11-12 ENCOUNTER — Ambulatory Visit (INDEPENDENT_AMBULATORY_CARE_PROVIDER_SITE_OTHER): Payer: Medicaid Other | Admitting: Family Medicine

## 2020-11-12 ENCOUNTER — Other Ambulatory Visit: Payer: Medicaid Other

## 2020-11-12 ENCOUNTER — Encounter: Payer: Self-pay | Admitting: *Deleted

## 2020-11-12 VITALS — BP 115/77 | HR 150 | Wt 196.0 lb

## 2020-11-12 DIAGNOSIS — Z23 Encounter for immunization: Secondary | ICD-10-CM

## 2020-11-12 DIAGNOSIS — O0990 Supervision of high risk pregnancy, unspecified, unspecified trimester: Secondary | ICD-10-CM | POA: Diagnosis not present

## 2020-11-12 DIAGNOSIS — F1190 Opioid use, unspecified, uncomplicated: Secondary | ICD-10-CM

## 2020-11-12 DIAGNOSIS — Z2839 Other underimmunization status: Secondary | ICD-10-CM

## 2020-11-12 MED ORDER — SUBOXONE 2-0.5 MG SL FILM: ORAL_FILM | SUBLINGUAL | 0 refills | Status: DC

## 2020-11-12 NOTE — Progress Notes (Signed)
   Subjective:  Dawn Huff is a 25 y.o. G4P0030 at 78w0dbeing seen today for ongoing prenatal care.  She is currently monitored for the following issues for this high-risk pregnancy and has Supervision of high risk pregnancy, antepartum; Opioid use disorder; and Rubella non-immune status, antepartum on their problem list.  Patient reports no complaints.  Contractions: Not present. Vag. Bleeding: None.  Movement: Present. Denies leaking of fluid.   The following portions of the patient's history were reviewed and updated as appropriate: allergies, current medications, past family history, past medical history, past social history, past surgical history and problem list. Problem list updated.  Objective:   Vitals:   11/12/20 0832  BP: 115/77  Pulse: (!) 150  Weight: 196 lb (88.9 kg)    Fetal Status: Fetal Heart Rate (bpm): 153   Movement: Present     General:  Alert, oriented and cooperative. Patient is in no acute distress.  Skin: Skin is warm and dry. No rash noted.   Cardiovascular: Normal heart rate noted  Respiratory: Normal respiratory effort, no problems with respiration noted  Abdomen: Soft, gravid, appropriate for gestational age. Pain/Pressure: Absent     Pelvic: Vag. Bleeding: None     Cervical exam deferred        Extremities: Normal range of motion.  Edema: Trace  Mental Status: Normal mood and affect. Normal behavior. Normal judgment and thought content.   Urinalysis:      Assessment and Plan:  Pregnancy: G4P0030 at 277w0d1. Supervision of high risk pregnancy, antepartum BP and FHR normal TDaP and 28wk labs today  2. Opioid use disorder Stable on 2 mg in AM and 1 mg PM Refill sent UDS today  3. Rubella non-immune status, antepartum Offer MMR PP  Preterm labor symptoms and general obstetric precautions including but not limited to vaginal bleeding, contractions, leaking of fluid and fetal movement were reviewed in detail with the patient. Please refer to  After Visit Summary for other counseling recommendations.  Return in 2 weeks (on 11/26/2020).   EcClarnce FlockMD

## 2020-11-12 NOTE — Patient Instructions (Signed)

## 2020-11-13 LAB — RPR: RPR Ser Ql: NONREACTIVE

## 2020-11-13 LAB — HIV ANTIBODY (ROUTINE TESTING W REFLEX): HIV Screen 4th Generation wRfx: NONREACTIVE

## 2020-11-13 LAB — CBC
Hematocrit: 32.3 % — ABNORMAL LOW (ref 34.0–46.6)
Hemoglobin: 10.7 g/dL — ABNORMAL LOW (ref 11.1–15.9)
MCH: 31.7 pg (ref 26.6–33.0)
MCHC: 33.1 g/dL (ref 31.5–35.7)
MCV: 96 fL (ref 79–97)
Platelets: 216 10*3/uL (ref 150–450)
RBC: 3.38 x10E6/uL — ABNORMAL LOW (ref 3.77–5.28)
RDW: 12.4 % (ref 11.7–15.4)
WBC: 9.4 10*3/uL (ref 3.4–10.8)

## 2020-11-14 ENCOUNTER — Other Ambulatory Visit: Payer: Self-pay | Admitting: General Practice

## 2020-11-14 DIAGNOSIS — O0990 Supervision of high risk pregnancy, unspecified, unspecified trimester: Secondary | ICD-10-CM

## 2020-11-14 LAB — PAIN MGT SCRN (14 DRUGS), UR
Amphetamine Scrn, Ur: NEGATIVE ng/mL
BARBITURATE SCREEN URINE: NEGATIVE ng/mL
BENZODIAZEPINE SCREEN, URINE: NEGATIVE ng/mL
Buprenorphine, Urine: POSITIVE ng/mL — AB
CANNABINOIDS UR QL SCN: NEGATIVE ng/mL
Cocaine (Metab) Scrn, Ur: NEGATIVE ng/mL
Creatinine(Crt), U: 48.5 mg/dL (ref 20.0–300.0)
Fentanyl, Urine: NEGATIVE pg/mL
Meperidine Screen, Urine: NEGATIVE ng/mL
Methadone Screen, Urine: NEGATIVE ng/mL
OXYCODONE+OXYMORPHONE UR QL SCN: NEGATIVE ng/mL
Opiate Scrn, Ur: NEGATIVE ng/mL
Ph of Urine: 7 (ref 4.5–8.9)
Phencyclidine Qn, Ur: NEGATIVE ng/mL
Propoxyphene Scrn, Ur: NEGATIVE ng/mL
Tramadol Screen, Urine: NEGATIVE ng/mL

## 2020-11-15 ENCOUNTER — Other Ambulatory Visit: Payer: Medicaid Other

## 2020-11-15 ENCOUNTER — Other Ambulatory Visit: Payer: Self-pay

## 2020-11-15 DIAGNOSIS — O0990 Supervision of high risk pregnancy, unspecified, unspecified trimester: Secondary | ICD-10-CM

## 2020-11-15 NOTE — Procedures (Signed)
ELECTROENCEPHALOGRAM REPORT  Date of Study: 11/11/2020  Patient's Name: Dawn Huff MRN: [id number] Date of Birth: Jul 19, 1995  Referring Provider: Dr. Patrcia Dolly  Clinical History: This is a 25 year old pregnant woman with episodes of loss of consciousness, right-sided weakness. EEG for classification.   Medications: FLEXERIL 10 MG tablet PROTONIX 20 MG tablet GLYCOLAX/MIRALAX 17 GM/SCOOP powder Prenatal Vit-Fe Fumarate-FA (M-NATAL PLUS) 27-1 MG TABS PHENERGAN 25 MG tablet SUBOXONE 2-0.5 MG FILM  Technical Summary: A multichannel digital 1-hour EEG recording measured by the international 10-20 system with electrodes applied with paste and impedances below 5000 ohms performed in our laboratory with EKG monitoring in an awake and asleep patient.  Hyperventilation was not performed. Photic stimulation was performed.  The digital EEG was referentially recorded, reformatted, and digitally filtered in a variety of bipolar and referential montages for optimal display.    Description: The patient is awake and asleep during the recording.  During maximal wakefulness, there is a symmetric, medium voltage 10 Hz posterior dominant rhythm that attenuates with eye opening.  The record is symmetric.  During drowsiness and sleep, there is an increase in theta slowing of the background.  Vertex waves and symmetric sleep spindles were seen.  Photic stimulation did not elicit any abnormalities.  There were no epileptiform discharges or electrographic seizures seen.    EKG lead was unremarkable.  Impression: This 1-hour awake and asleep EEG is normal.    Clinical Correlation: A normal EEG does not exclude a clinical diagnosis of epilepsy.  If further clinical questions remain, prolonged EEG may be helpful.  Clinical correlation is advised.   Patrcia Dolly, M.D.

## 2020-11-16 LAB — CBC
Hematocrit: 32.4 % — ABNORMAL LOW (ref 34.0–46.6)
Hemoglobin: 11 g/dL — ABNORMAL LOW (ref 11.1–15.9)
MCH: 32 pg (ref 26.6–33.0)
MCHC: 34 g/dL (ref 31.5–35.7)
MCV: 94 fL (ref 79–97)
Platelets: 220 10*3/uL (ref 150–450)
RBC: 3.44 x10E6/uL — ABNORMAL LOW (ref 3.77–5.28)
RDW: 12.2 % (ref 11.7–15.4)
WBC: 9.5 10*3/uL (ref 3.4–10.8)

## 2020-11-16 LAB — GLUCOSE TOLERANCE, 2 HOURS W/ 1HR
Glucose, 1 hour: 132 mg/dL (ref 65–179)
Glucose, 2 hour: 99 mg/dL (ref 65–152)
Glucose, Fasting: 82 mg/dL (ref 65–91)

## 2020-11-16 LAB — RPR: RPR Ser Ql: NONREACTIVE

## 2020-11-16 LAB — HIV ANTIBODY (ROUTINE TESTING W REFLEX): HIV Screen 4th Generation wRfx: NONREACTIVE

## 2020-11-23 ENCOUNTER — Ambulatory Visit
Admission: RE | Admit: 2020-11-23 | Discharge: 2020-11-23 | Disposition: A | Discharge status: Home / Self Care | Payer: Medicaid Other | Source: Ambulatory Visit | Attending: Neurology | Admitting: Neurology

## 2020-11-23 ENCOUNTER — Other Ambulatory Visit: Payer: Self-pay

## 2020-11-23 DIAGNOSIS — R519 Headache, unspecified: Secondary | ICD-10-CM

## 2020-11-23 DIAGNOSIS — R4020 Unspecified coma: Secondary | ICD-10-CM

## 2020-11-23 DIAGNOSIS — R531 Weakness: Secondary | ICD-10-CM

## 2020-11-23 IMAGING — MR MR HEAD W/O CM
12 series · 48 of 48 positions shown · non-contrast
Comparison: Prior CT from [DATE].

CLINICAL DATA: Initial evaluation for seizure.

EXAM:
MRI HEAD WITHOUT CONTRAST
MRV HEAD WITHOUT CONTRAST
TECHNIQUE: Multiplanar, multi-echo pulse sequences of the brain and surrounding
structures were acquired without intravenous contrast. Angiographic
images of the intracranial venous structures were acquired using MRV
technique without intravenous contrast.

[Series 2: T1 · sagittal · 5.0mm · 0.45mm/px · 2 of 21 slices shown]
[im 1/21]
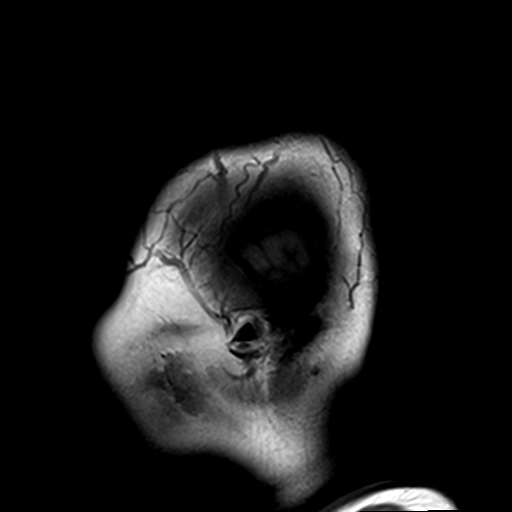
[im 21/21]
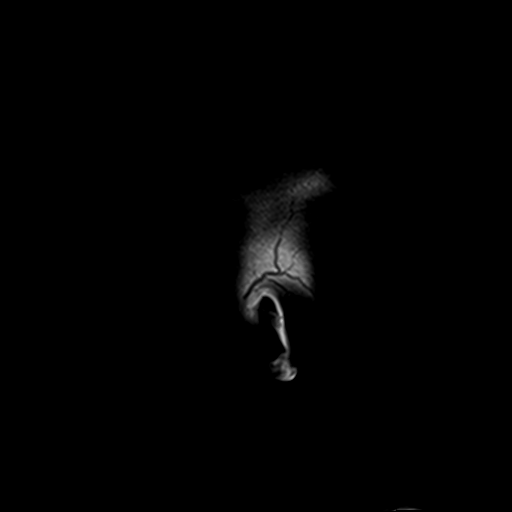

[Series 3: DWI · axial · 3.0mm · 1.80mm/px · z∈[-98,+49]mm · 8 of 100 slices shown (1 of 4)]
[im 1/100]
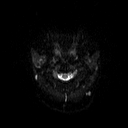
[im 15/100]
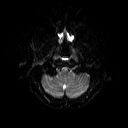
[im 29/100]
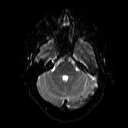
[im 43/100]
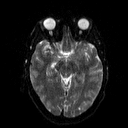
[im 57/100]
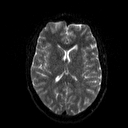
[im 71/100]
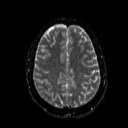
[im 85/100]
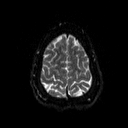
[im 100/100]
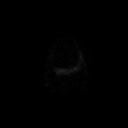

[Series 4: DWI · axial · 3.0mm · 1.80mm/px · z∈[-98,+49]mm · 4 of 49 slices shown (2 of 4)]
[im 1/49]
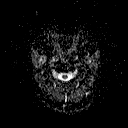
[im 17/49]
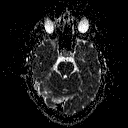
[im 33/49]
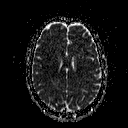
[im 49/49]
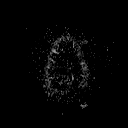

[Series 5: DWI · coronal · 5.0mm · 1.80mm/px · 6 of 74 slices shown (3 of 4)]
[im 1/74]
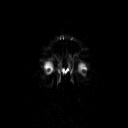
[im 15/74]
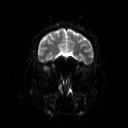
[im 30/74]
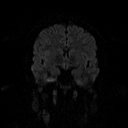
[im 44/74]
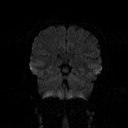
[im 59/74]
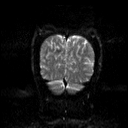
[im 74/74]
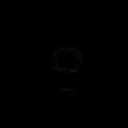

[Series 6: DWI · coronal · 5.0mm · 1.80mm/px · 3 of 37 slices shown (4 of 4)]
[im 1/37]
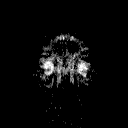
[im 19/37]
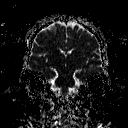
[im 37/37]
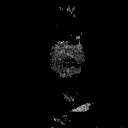

[Series 7: T2 · axial · 5.0mm · 0.72mm/px · z∈[-92,+55]mm · 2 of 22 slices shown (1 of 3)]
[im 1/22]
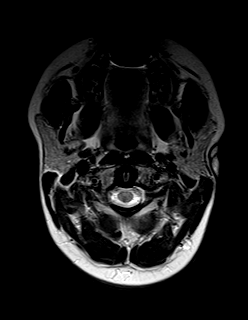
[im 22/22]
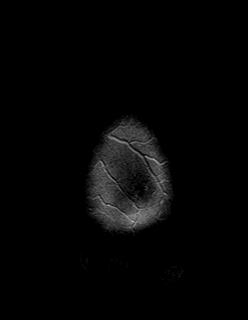

[Series 8: FLAIR · axial · 3.0mm · 0.45mm/px · z∈[-86,+49]mm · 2 of 30 slices shown (1 of 2)]
[im 1/30]
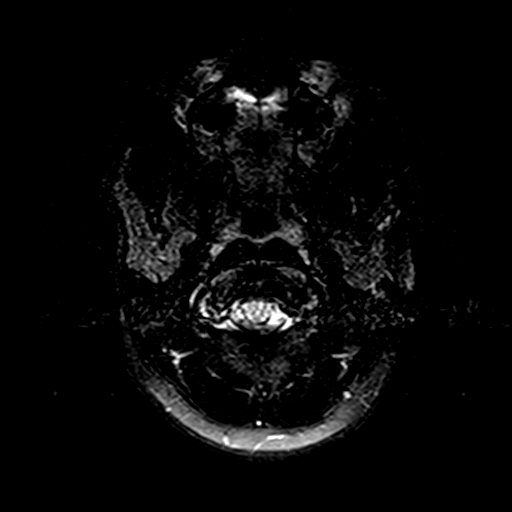
[im 30/30]
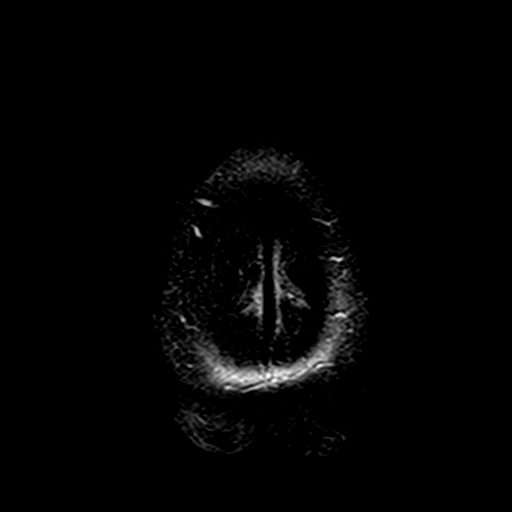

[Series 9: T2 · coronal · 3.0mm · 0.35mm/px · 2 of 30 slices shown (2 of 3)]
[im 1/30]
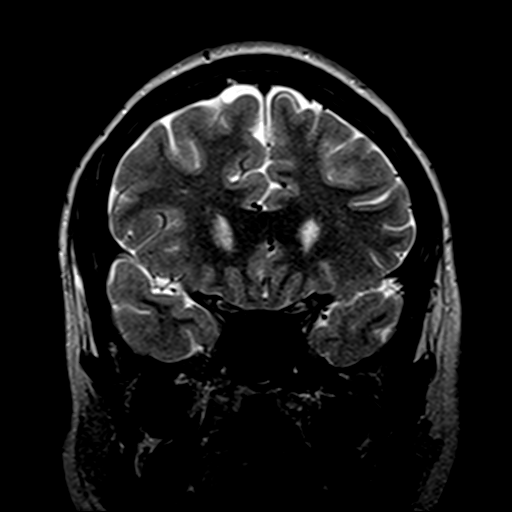
[im 30/30]
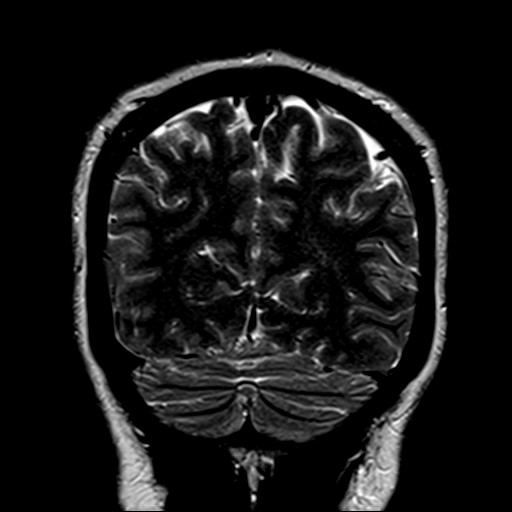

[Series 10: FLAIR · coronal · 3.0mm · 0.47mm/px · 2 of 30 slices shown (2 of 2)]
[im 1/30]
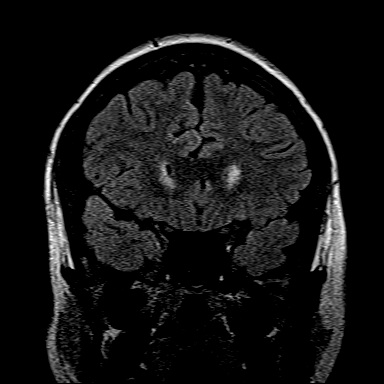
[im 30/30]
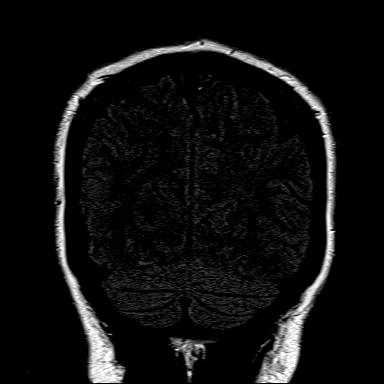

[Series 12: swi_images · axial · 4.0mm · 0.90mm/px · z∈[-89,+51]mm · 3 of 36 slices shown]
[im 1/36]
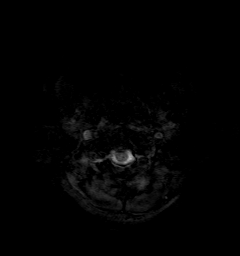
[im 18/36]
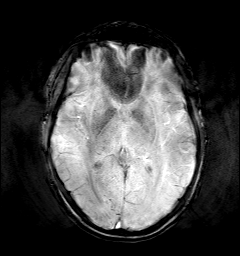
[im 36/36]
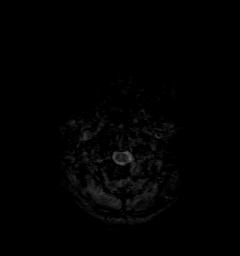

[Series 13: t1_mpr_tra · axial · 1.0mm · 0.71mm/px · z∈[-90,+53]mm · 12 of 144 slices shown]
[im 1/144]
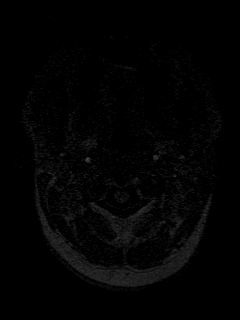
[im 14/144]
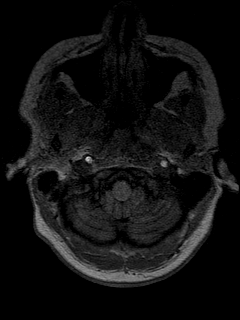
[im 27/144]
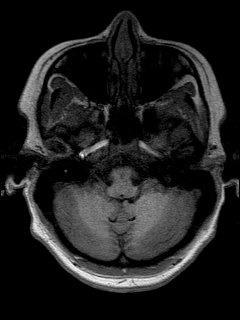
[im 40/144]
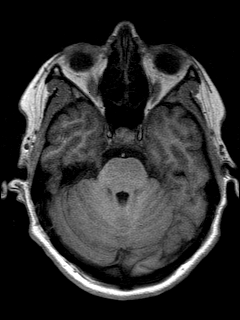
[im 53/144]
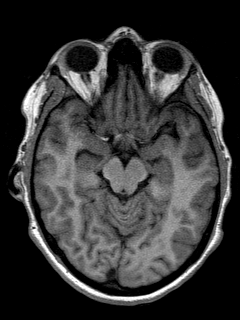
[im 66/144]
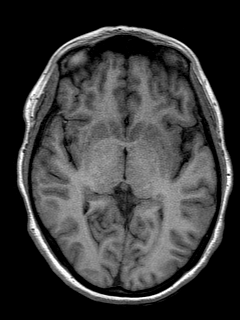
[im 79/144]
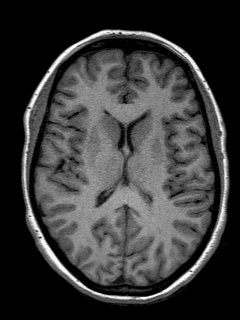
[im 92/144]
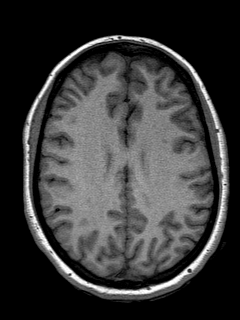
[im 105/144]
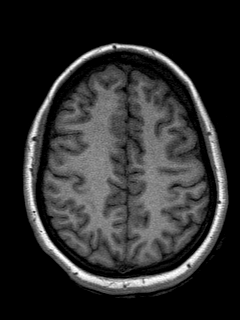
[im 118/144]
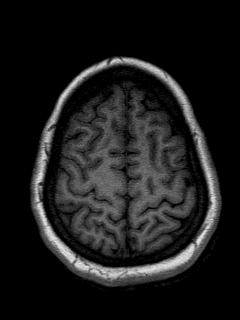
[im 131/144]
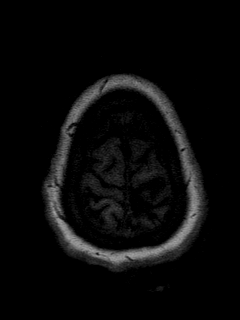
[im 144/144]
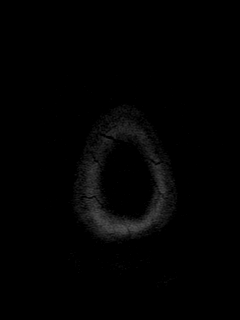

[Series 14: T2 · coronal · 5.0mm · 0.45mm/px · 2 of 25 slices shown (3 of 3)]
[im 1/25]
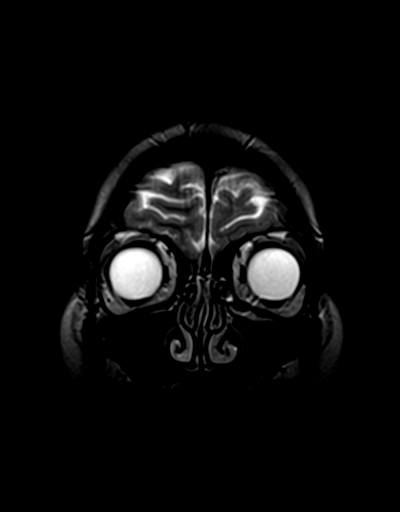
[im 25/25]
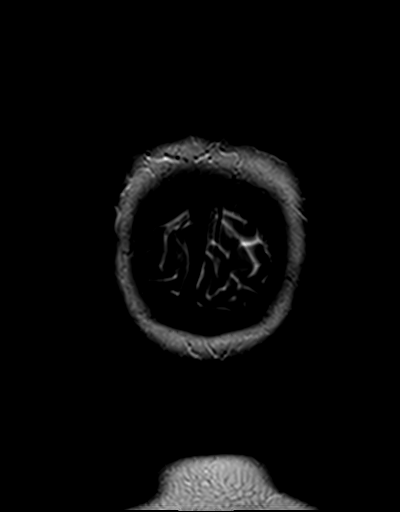

[48 of 48 positions shown; findings below may reference images not displayed]

FINDINGS: MRI HEAD WITHOUT CONTRAST

Brain: Cerebral volume within normal limits. Few scattered
subcentimeter foci of T2/FLAIR hyperintensity noted involving the
periventricular and deep white matter both cerebral hemispheres,
nonspecific, but overall mild in nature. For reference purposes, the
largest of these foci seen involving the periventricular white
matter adjacent to the frontal horn of the left lateral ventricle
and measures 11 mm (series 8, image 17). No infratentorial foci. No
significant corresponding T1 black holes.

No other focal parenchymal signal abnormality. No evidence for acute
or subacute ischemia. No encephalomalacia to suggest chronic
cortical infarction. No evidence for acute or chronic intracranial
hemorrhage.

No mass lesion, midline shift or mass effect. No hydrocephalus or
extra-axial fluid collection. Pituitary gland suprasellar region
within normal limits. Midline structures intact and normal. Thin
section imaging through the temporal lobes demonstrates no intrinsic
temporal lobe abnormality. No evidence for mesial temporal
sclerosis.

Vascular: Major intracranial vascular flow voids are maintained.

Skull and upper cervical spine: Craniocervical junction normal. No
Chiari malformation. Bone marrow signal intensity grossly normal. No
scalp soft tissue abnormality.

Sinuses/Orbits: Globes orbital soft tissues demonstrate no acute
finding. Paranasal sinuses are clear. No mastoid effusion. Inner ear
structures grossly normal.

Other: None.

MR VENOGRAM WITHOUT CONTRAST

Normal flow related signal seen throughout the superior sagittal
sinus to the level of the torcula. Transverse and sigmoid sinuses
appear patent as do the visualized proximal internal jugular veins.
Straight sinus, vein of BIILO, internal cerebral veins, and basal
veins of BIILO appear patent. No evidence for dural sinus
thrombosis
IMPRESSION: 1. Few scattered subcentimeter foci of T2/FLAIR hyperintensity
involving the periventricular and deep white matter both cerebral
hemispheres as above, nonspecific, but overall mild in nature.
Differential considerations are broad, and include sequelae of
accelerated/hereditary chronic small-vessel ischemia, changes
related to prior trauma, hypercoagulable state, vasculitis, sequelae
of complicated migraines, prior infectious or inflammatory process,
or demyelination.
2. Otherwise normal brain MRI. No acute intracranial abnormality
identified.
3. Normal intracranial MRV. No evidence for dural sinus thrombosis.

## 2020-11-23 IMAGING — MR MR MRV HEAD W/O CM
2 series · 23 of 48 positions shown · non-contrast
Comparison: Prior CT from [DATE].

CLINICAL DATA: Initial evaluation for seizure.

EXAM:
MRI HEAD WITHOUT CONTRAST
MRV HEAD WITHOUT CONTRAST
TECHNIQUE: Multiplanar, multi-echo pulse sequences of the brain and surrounding
structures were acquired without intravenous contrast. Angiographic
images of the intracranial venous structures were acquired using MRV
technique without intravenous contrast.

[Series 2: (id) coronal · coronal · 3.0mm · 0.49mm/px · 14 of 95 slices shown]
[im 1/95]
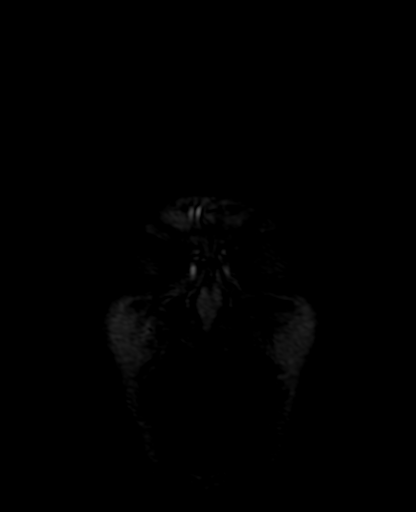
[im 4/95]
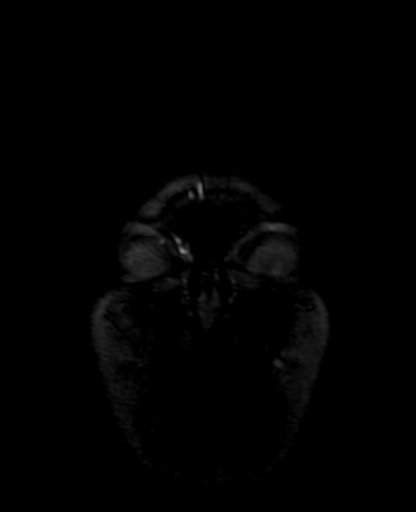
[im 8/95]
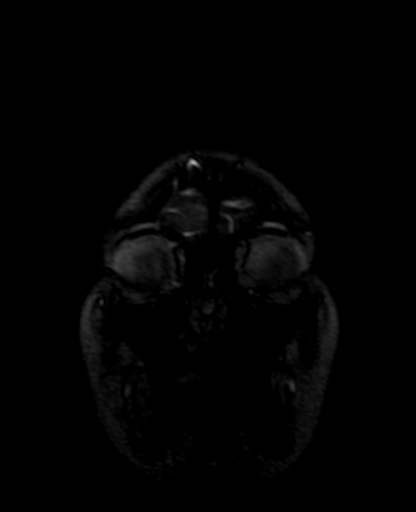
[im 11/95]
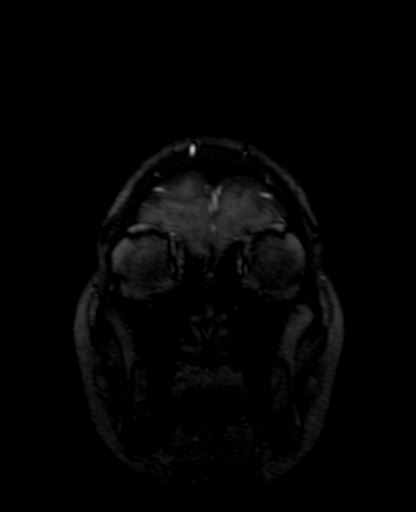
[im 15/95]
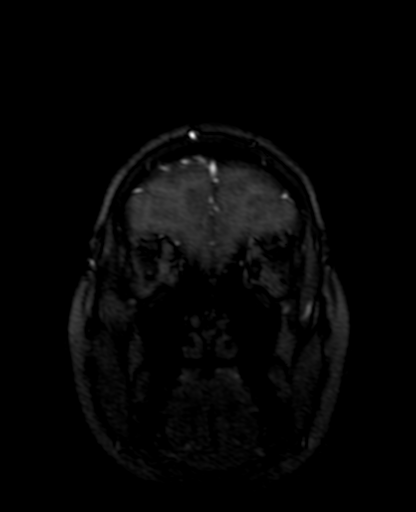
[im 19/95]
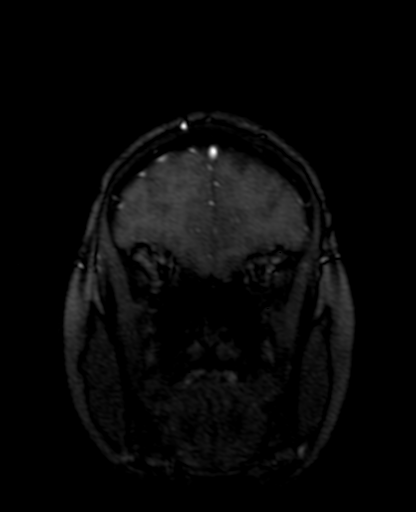
[im 29/95]
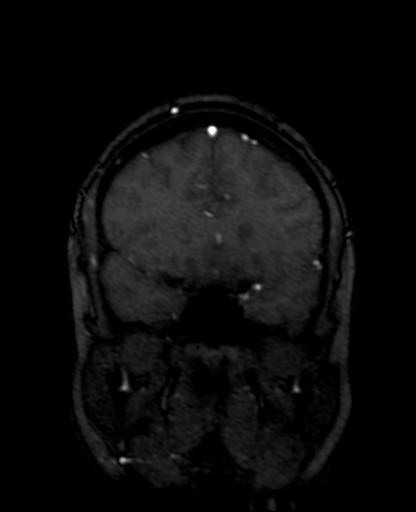
[im 40/95]
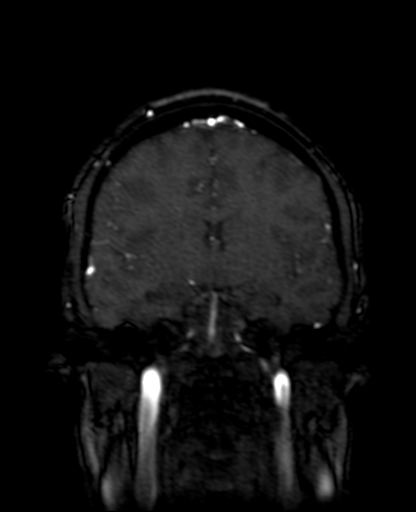
[im 48/95]
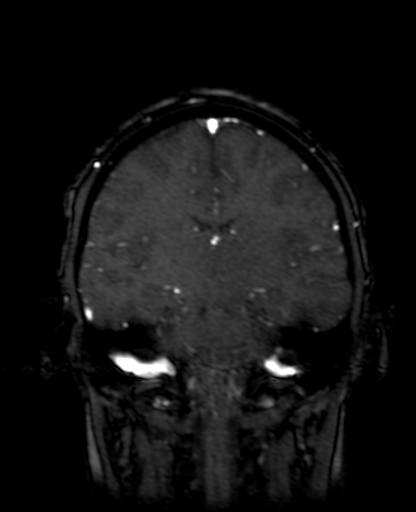
[im 55/95]
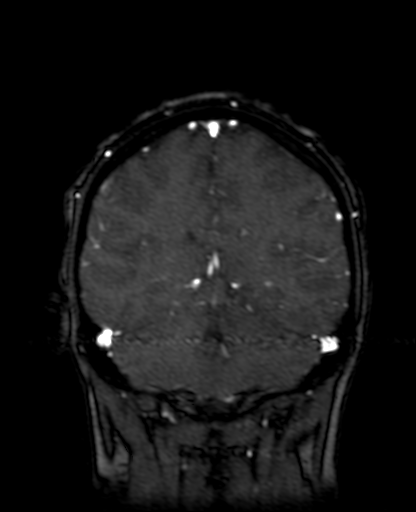
[im 66/95]
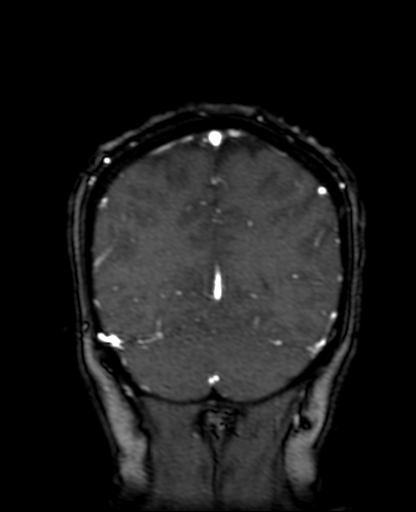
[im 76/95]
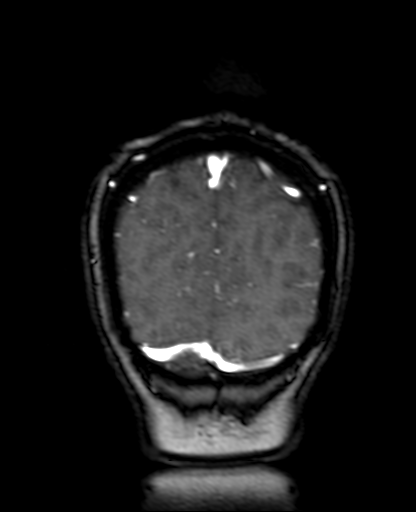
[im 80/95]
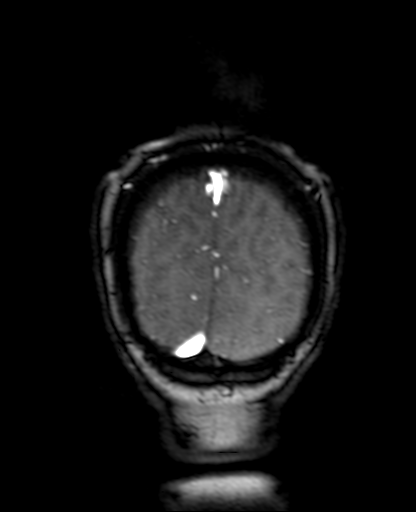
[im 91/95]
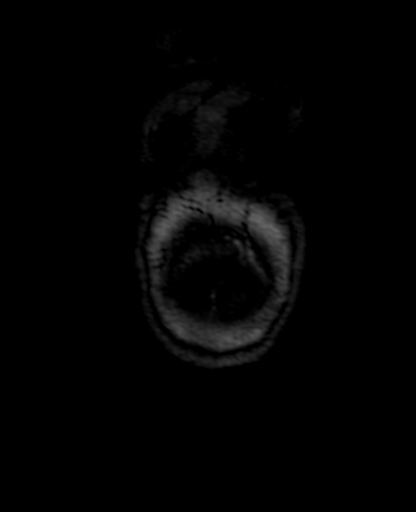

[Series 6: (id) sag · sagittal · 3.0mm · 0.49mm/px · 9 of 75 slices shown]
[im 4/75]
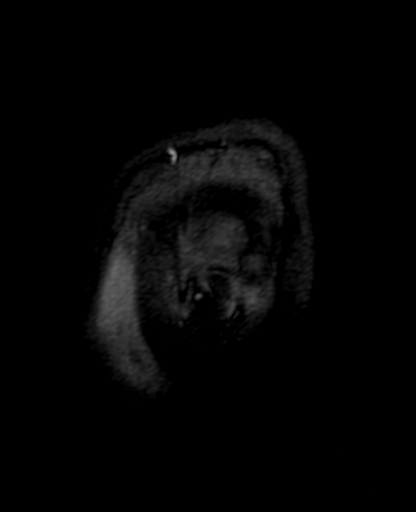
[im 12/75]
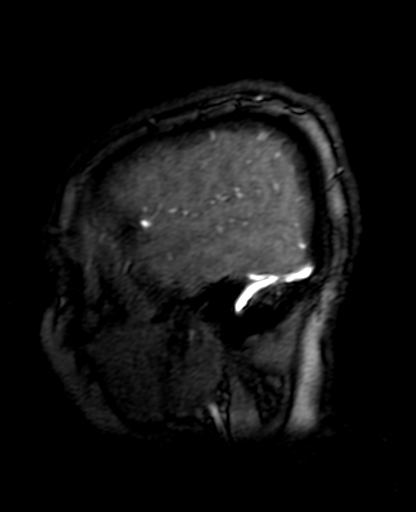
[im 23/75]
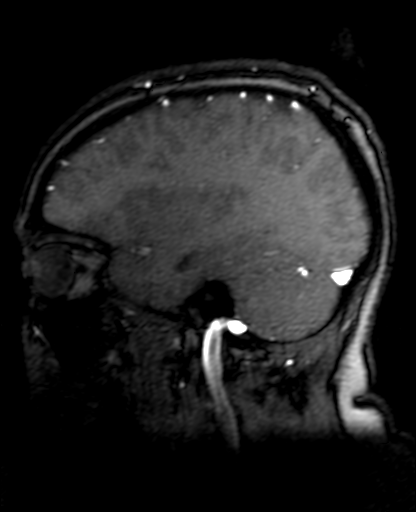
[im 34/75]
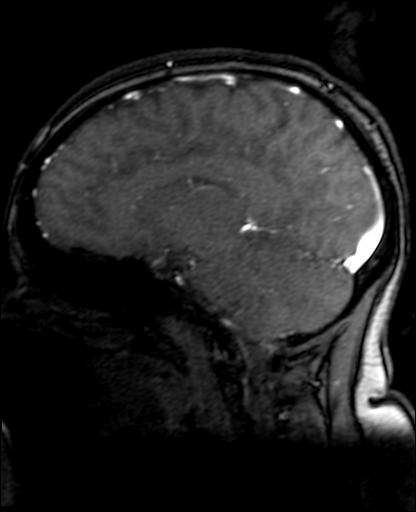
[im 38/75]
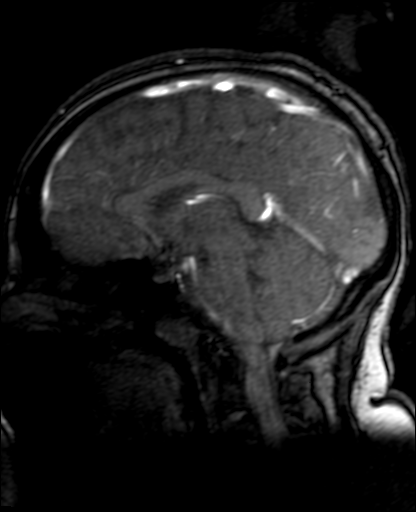
[im 41/75]
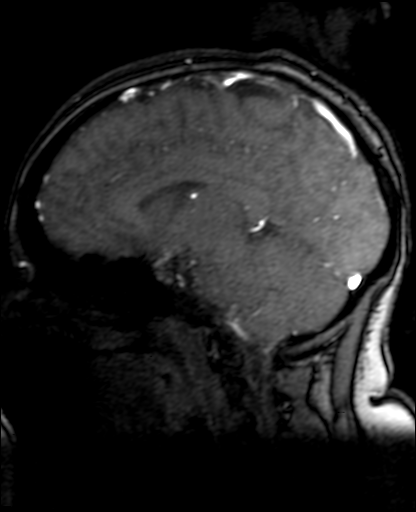
[im 52/75]
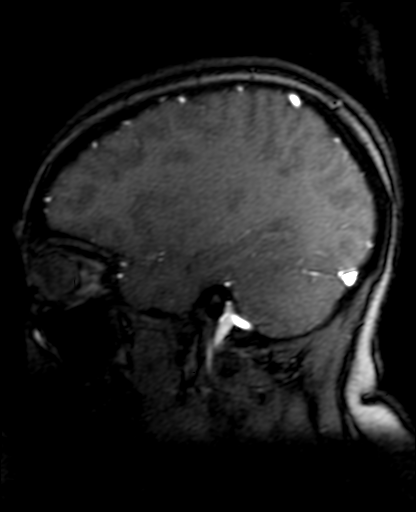
[im 63/75]
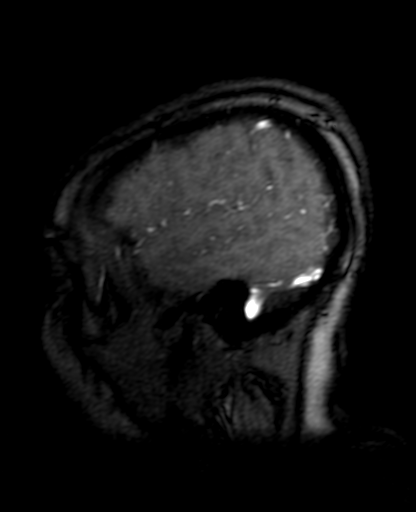
[im 71/75]
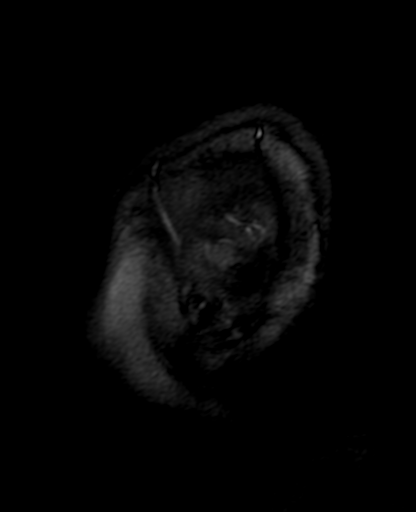

[23 of 48 positions shown; findings below may reference images not displayed]

FINDINGS: MRI HEAD WITHOUT CONTRAST

Brain: Cerebral volume within normal limits. Few scattered
subcentimeter foci of T2/FLAIR hyperintensity noted involving the
periventricular and deep white matter both cerebral hemispheres,
nonspecific, but overall mild in nature. For reference purposes, the
largest of these foci seen involving the periventricular white
matter adjacent to the frontal horn of the left lateral ventricle
and measures 11 mm (series 8, image 17). No infratentorial foci. No
significant corresponding T1 black holes.

No other focal parenchymal signal abnormality. No evidence for acute
or subacute ischemia. No encephalomalacia to suggest chronic
cortical infarction. No evidence for acute or chronic intracranial
hemorrhage.

No mass lesion, midline shift or mass effect. No hydrocephalus or
extra-axial fluid collection. Pituitary gland suprasellar region
within normal limits. Midline structures intact and normal. Thin
section imaging through the temporal lobes demonstrates no intrinsic
temporal lobe abnormality. No evidence for mesial temporal
sclerosis.

Vascular: Major intracranial vascular flow voids are maintained.

Skull and upper cervical spine: Craniocervical junction normal. No
Chiari malformation. Bone marrow signal intensity grossly normal. No
scalp soft tissue abnormality.

Sinuses/Orbits: Globes orbital soft tissues demonstrate no acute
finding. Paranasal sinuses are clear. No mastoid effusion. Inner ear
structures grossly normal.

Other: None.

MR VENOGRAM WITHOUT CONTRAST

Normal flow related signal seen throughout the superior sagittal
sinus to the level of the torcula. Transverse and sigmoid sinuses
appear patent as do the visualized proximal internal jugular veins.
Straight sinus, vein of BIILO, internal cerebral veins, and basal
veins of BIILO appear patent. No evidence for dural sinus
thrombosis
IMPRESSION: 1. Few scattered subcentimeter foci of T2/FLAIR hyperintensity
involving the periventricular and deep white matter both cerebral
hemispheres as above, nonspecific, but overall mild in nature.
Differential considerations are broad, and include sequelae of
accelerated/hereditary chronic small-vessel ischemia, changes
related to prior trauma, hypercoagulable state, vasculitis, sequelae
of complicated migraines, prior infectious or inflammatory process,
or demyelination.
2. Otherwise normal brain MRI. No acute intracranial abnormality
identified.
3. Normal intracranial MRV. No evidence for dural sinus thrombosis.

## 2020-11-26 ENCOUNTER — Encounter: Payer: Self-pay | Admitting: Family Medicine

## 2020-11-26 ENCOUNTER — Other Ambulatory Visit: Payer: Self-pay

## 2020-11-26 ENCOUNTER — Ambulatory Visit (INDEPENDENT_AMBULATORY_CARE_PROVIDER_SITE_OTHER): Payer: Medicaid Other | Admitting: Family Medicine

## 2020-11-26 VITALS — BP 107/66 | HR 108 | Wt 199.4 lb

## 2020-11-26 DIAGNOSIS — K219 Gastro-esophageal reflux disease without esophagitis: Secondary | ICD-10-CM

## 2020-11-26 DIAGNOSIS — Z2839 Other underimmunization status: Secondary | ICD-10-CM

## 2020-11-26 DIAGNOSIS — O09899 Supervision of other high risk pregnancies, unspecified trimester: Secondary | ICD-10-CM

## 2020-11-26 DIAGNOSIS — F1190 Opioid use, unspecified, uncomplicated: Secondary | ICD-10-CM

## 2020-11-26 DIAGNOSIS — R55 Syncope and collapse: Secondary | ICD-10-CM

## 2020-11-26 DIAGNOSIS — Z3A29 29 weeks gestation of pregnancy: Secondary | ICD-10-CM

## 2020-11-26 DIAGNOSIS — O0990 Supervision of high risk pregnancy, unspecified, unspecified trimester: Secondary | ICD-10-CM

## 2020-11-26 MED ORDER — PANTOPRAZOLE SODIUM 20 MG PO TBEC: 20.0000 mg | DELAYED_RELEASE_TABLET | Freq: Every day | ORAL | 5 refills | Status: DC

## 2020-11-26 MED ORDER — CYCLOBENZAPRINE HCL 10 MG PO TABS: 10.0000 mg | ORAL_TABLET | Freq: Three times a day (TID) | ORAL | 1 refills | Status: DC | PRN

## 2020-11-26 MED ORDER — SUBOXONE 2-0.5 MG SL FILM: ORAL_FILM | SUBLINGUAL | 0 refills | Status: DC

## 2020-11-26 MED ORDER — PROMETHAZINE HCL 25 MG PO TABS: 25.0000 mg | ORAL_TABLET | Freq: Four times a day (QID) | ORAL | 2 refills | Status: DC | PRN

## 2020-11-26 MED ORDER — POLYETHYLENE GLYCOL 3350 17 GM/SCOOP PO POWD: 17.0000 g | Freq: Every day | ORAL | 1 refills | Status: DC | PRN

## 2020-11-26 MED ORDER — M-NATAL PLUS 27-1 MG PO TABS: 1.0000 | ORAL_TABLET | Freq: Every day | ORAL | 11 refills | Status: DC

## 2020-11-26 NOTE — Progress Notes (Signed)
   Subjective:  Dawn Huff is a 25 y.o. G4P0030 at 32w0dbeing seen today for ongoing prenatal care.  She is currently monitored for the following issues for this high-risk pregnancy and has Supervision of high risk pregnancy, antepartum; Opioid use disorder; Rubella non-immune status, antepartum; and Syncope on their problem list.  Patient reports no complaints.  Contractions: Not present. Vag. Bleeding: None.  Movement: Present. Denies leaking of fluid.   The following portions of the patient's history were reviewed and updated as appropriate: allergies, current medications, past family history, past medical history, past social history, past surgical history and problem list. Problem list updated.  Objective:   Vitals:   11/26/20 1546  BP: 107/66  Pulse: (!) 108  Weight: 199 lb 6.4 oz (90.4 kg)    Fetal Status: Fetal Heart Rate (bpm): 147   Movement: Present     General:  Alert, oriented and cooperative. Patient is in no acute distress.  Skin: Skin is warm and dry. No rash noted.   Cardiovascular: Normal heart rate noted  Respiratory: Normal respiratory effort, no problems with respiration noted  Abdomen: Soft, gravid, appropriate for gestational age. Pain/Pressure: Absent     Pelvic: Vag. Bleeding: None     Cervical exam deferred        Extremities: Normal range of motion.  Edema: Trace  Mental Status: Normal mood and affect. Normal behavior. Normal judgment and thought content.   Urinalysis:      Assessment and Plan:  Pregnancy: G4P0030 at 244w0d1. Supervision of high risk pregnancy, antepartum BP and FHR normal  2. Opioid use disorder Stable on 61m13m AM and 1mg5mM Refill sent UDS today Will send NICU consult message  3. Rubella non-immune status, antepartum Offer MMR PP  4. Syncope, unspecified syncope type Undergoing workup with Neurology for syncope and possible seizures MRI/MRV with nonspecific findings, no DVT, EEG normal Has f/u scheduled  Preterm  labor symptoms and general obstetric precautions including but not limited to vaginal bleeding, contractions, leaking of fluid and fetal movement were reviewed in detail with the patient. Please refer to After Visit Summary for other counseling recommendations.  Return in 2 weeks (on 12/10/2020) for [facility] visit.   EcksClarnce Flock

## 2020-11-26 NOTE — Patient Instructions (Signed)

## 2020-11-27 ENCOUNTER — Telehealth: Payer: Self-pay

## 2020-11-27 NOTE — Telephone Encounter (Signed)
Pt called and informed reviewed brain MRI, brain looks fine, no tumor, stroke, bleed, or blood clot.  Pt is asking what does this mean  IMPRESSION: 1. Few scattered subcentimeter foci of T2/FLAIR hyperintensity involving the periventricular and deep white matter both cerebral hemispheres as above, nonspecific, but overall mild in nature. Differential considerations are broad, and include sequelae of accelerated/hereditary chronic small-vessel ischemia, changes related to prior trauma, hypercoagulable state, vasculitis, sequelae of complicated migraines, prior infectious or inflammatory process, or demyelination. Brain: Cerebral volume within normal limits. Few scattered subcentimeter foci of T2/FLAIR hyperintensity noted involving the periventricular and deep white matter both cerebral hemispheres, nonspecific, but overall mild in nature. For reference purposes, the largest of these foci seen involving the periventricular white matter adjacent to the frontal horn of the left lateral ventricle and measures 11 mm (series 8, image 17). No infratentorial foci. No significant corresponding T1 black holes.

## 2020-11-27 NOTE — Telephone Encounter (Signed)
-----   Message from Van Clines, MD sent at 11/26/2020 12:07 PM EDT ----- Pls let her know I reviewed brain MRI, brain looks fine, no tumor, stroke, bleed, or blood clot. Thanks

## 2020-11-27 NOTE — Telephone Encounter (Signed)
-----   Message from Karen M Aquino, MD sent at 11/26/2020 12:07 PM EDT ----- Pls let her know I reviewed brain MRI, brain looks fine, no tumor, stroke, bleed, or blood clot. Thanks 

## 2020-11-29 ENCOUNTER — Ambulatory Visit: Payer: Medicaid Other | Attending: Maternal & Fetal Medicine

## 2020-11-29 ENCOUNTER — Ambulatory Visit: Payer: Medicaid Other | Admitting: *Deleted

## 2020-11-29 ENCOUNTER — Other Ambulatory Visit: Payer: Self-pay

## 2020-11-29 VITALS — BP 119/65 | HR 108

## 2020-11-29 DIAGNOSIS — O99323 Drug use complicating pregnancy, third trimester: Secondary | ICD-10-CM | POA: Diagnosis not present

## 2020-11-29 DIAGNOSIS — F1190 Opioid use, unspecified, uncomplicated: Secondary | ICD-10-CM

## 2020-11-29 DIAGNOSIS — O2623 Pregnancy care for patient with recurrent pregnancy loss, third trimester: Secondary | ICD-10-CM | POA: Diagnosis not present

## 2020-11-29 DIAGNOSIS — Z3A29 29 weeks gestation of pregnancy: Secondary | ICD-10-CM | POA: Diagnosis not present

## 2020-11-29 DIAGNOSIS — Z3689 Encounter for other specified antenatal screening: Secondary | ICD-10-CM | POA: Insufficient documentation

## 2020-11-29 LAB — TOXASSURE SELECT 13 (MW), URINE

## 2020-11-29 IMAGING — US US MFM OB FOLLOW-UP
1 series · 14 of 25 positions shown · non-contrast
Comparison: none

[Series 1: us mfm ob follow-up · 25 acquisitions, 14 frames shown]
[im 1/25]
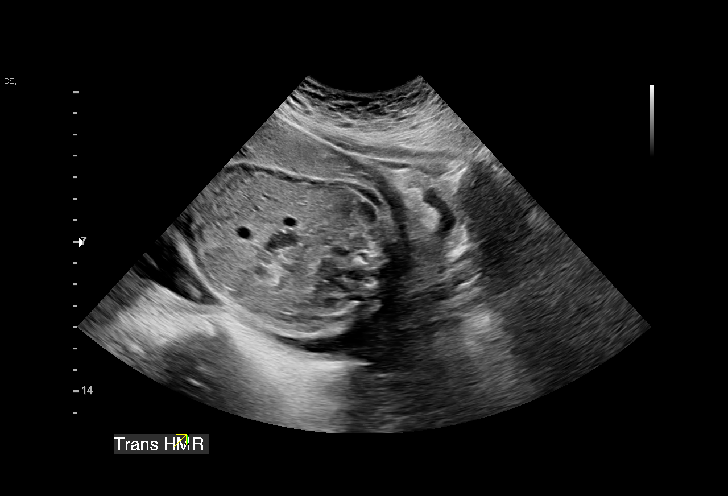
[im 3/25]
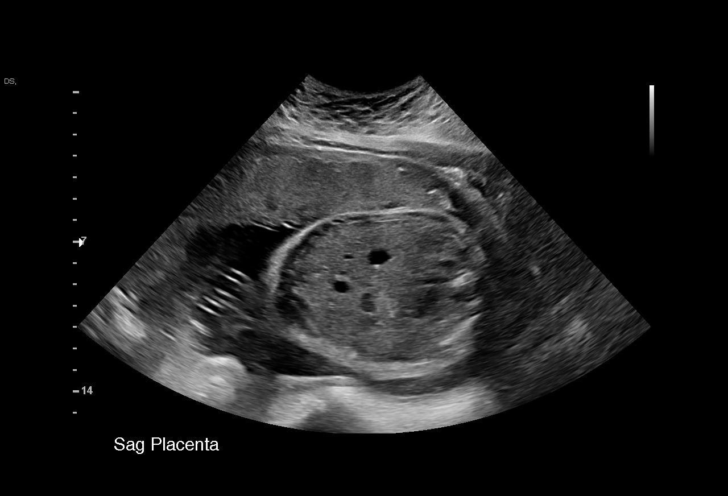
[im 5/25]
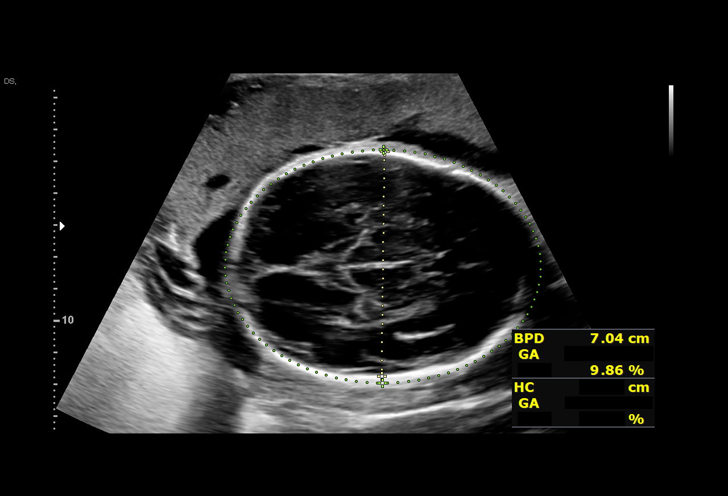
[im 7/25]
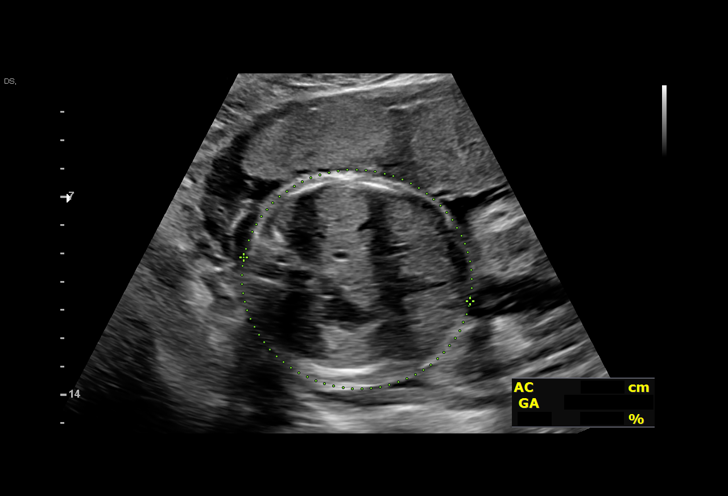
[im 9/25]
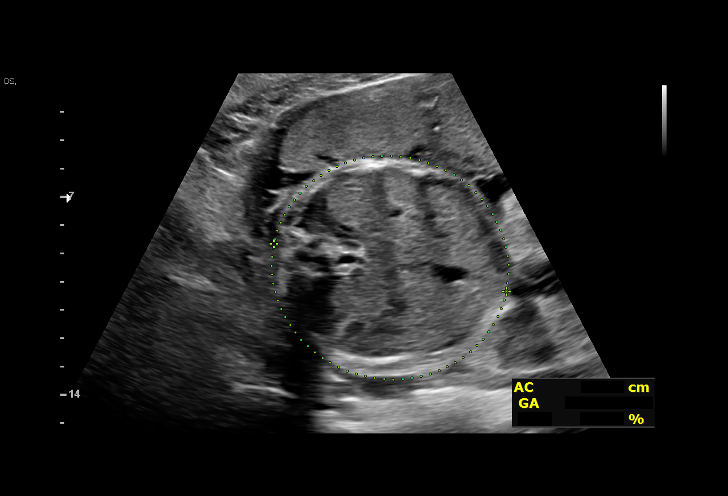
[im 10/25]
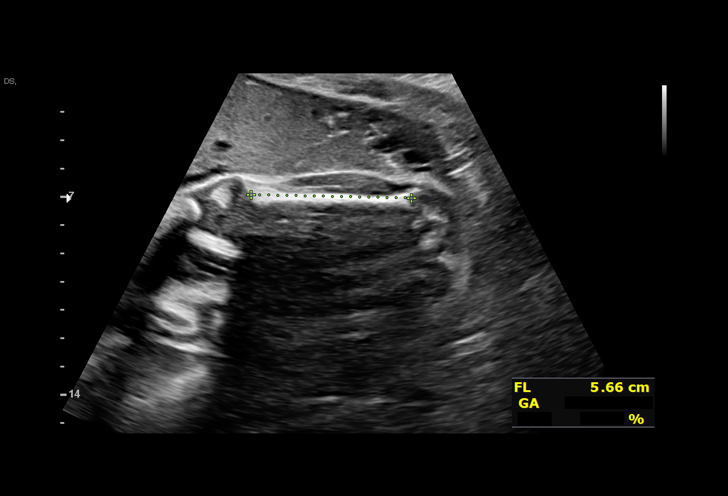
[im 12/25]
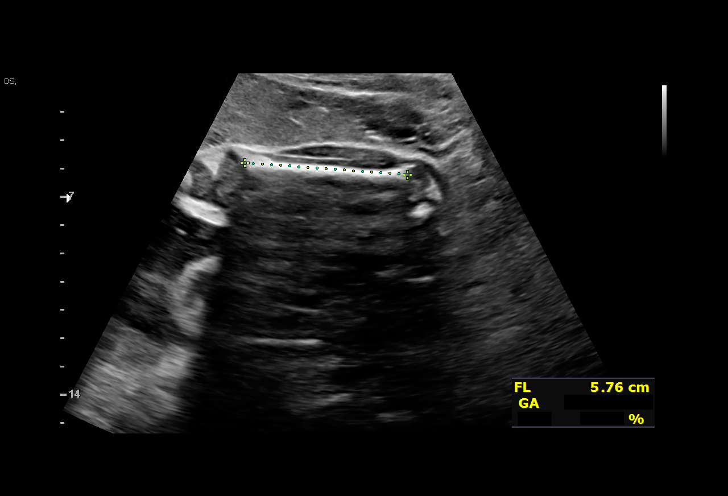
[im 14/25]
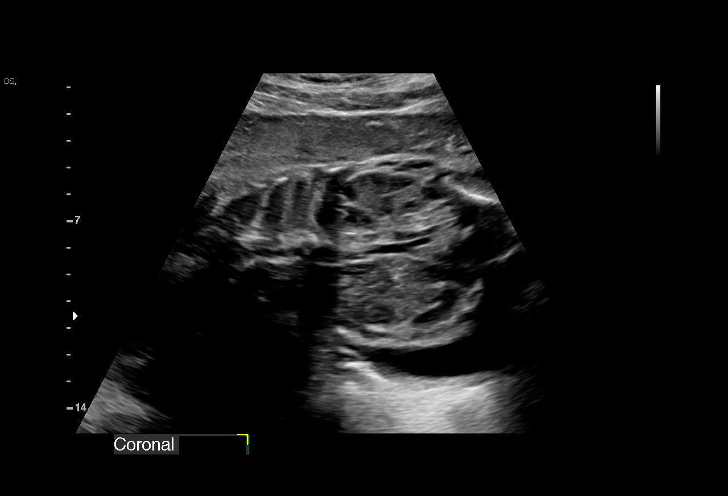
[im 16/25]
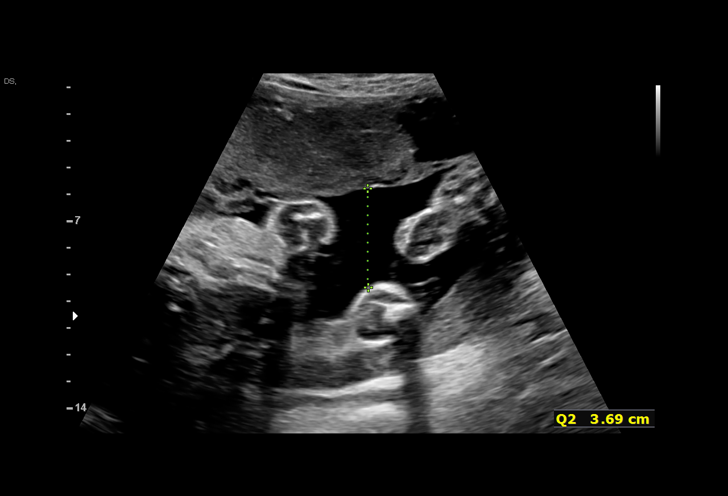
[im 17/25]
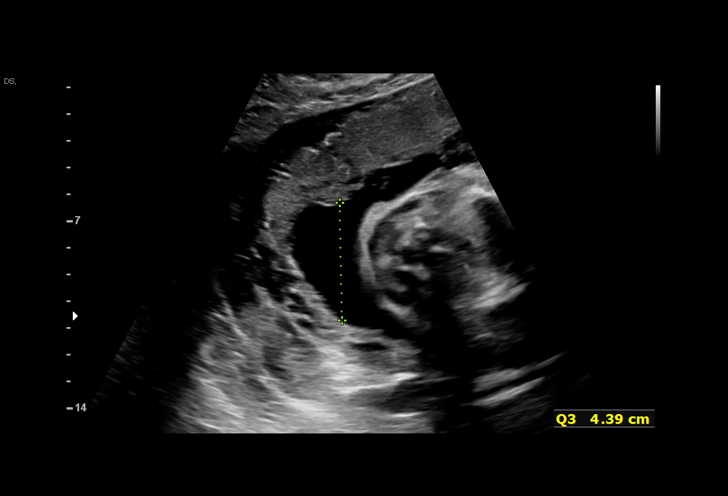
[im 19/25]
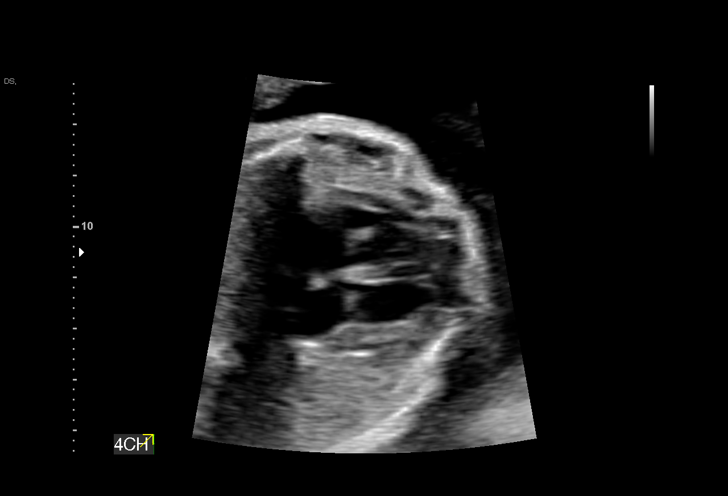
[im 21/25]
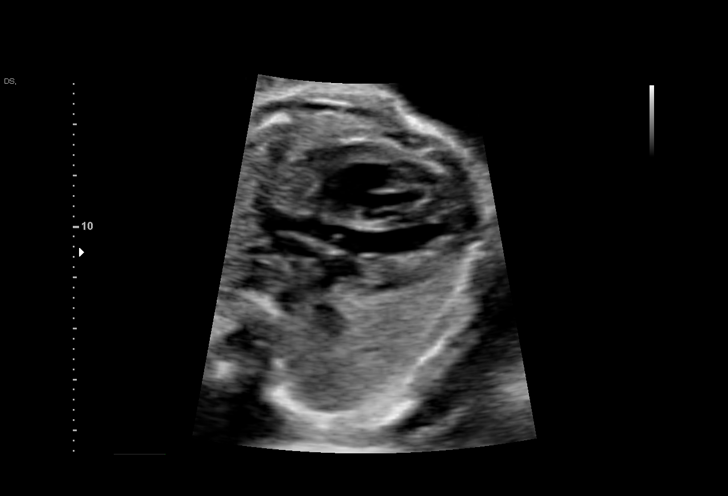
[im 23/25]
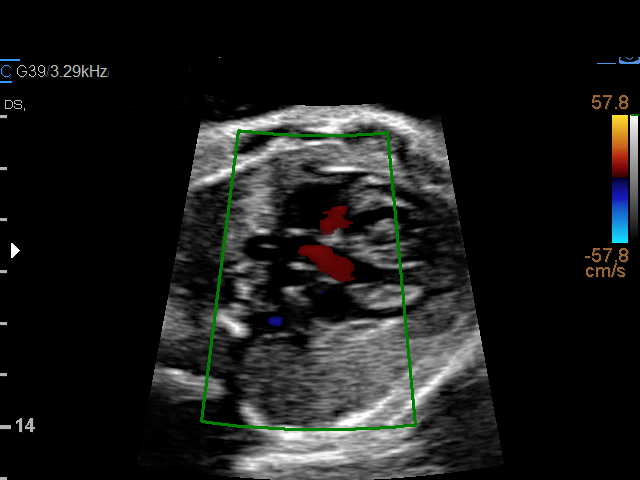
[im 25/25]
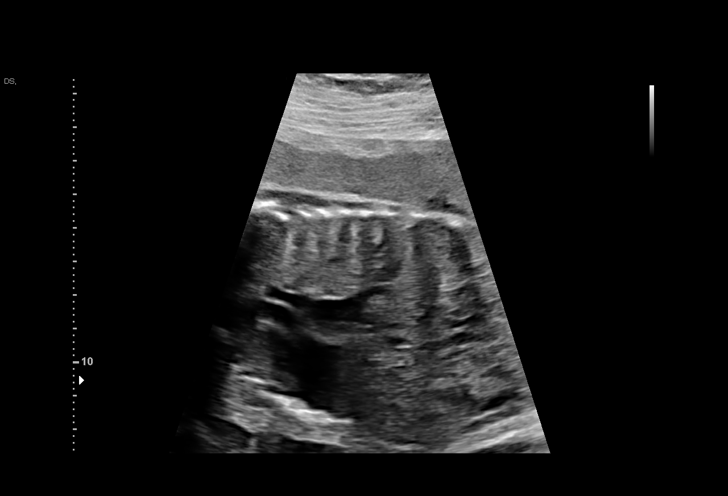

[14 of 25 positions shown; findings below may reference images not displayed]

NARDA

Indications

 Poor obstetric history-Recurrent (habitual)    [3P]
 abortion (3 consecutive ab's)
 Suboxone use (on suboxone)                     [3P]
 Low lying placenta, antepartum (Resolved)      [3P]
 Antenatal follow-up for nonvisualized fetal    [3P]
 anatomy
 29 weeks gestation of pregnancy
 LR NIPS, Neg AFP, Neg Horizon
Fetal Evaluation

 Num Of Fetuses:         1
 Cardiac Activity:       Observed
 Presentation:           Breech
 Placenta:               Anterior
 P. Cord Insertion:      Previously Visualized

 Amniotic Fluid
 AFI FV:      Within normal limits

 AFI Sum(cm)     %Tile       Largest Pocket(cm)
 15              53

 RUQ(cm)       RLQ(cm)       LUQ(cm)        LLQ(cm)

Biometry
 BPD:      70.5  mm     G. Age:  28w 2d         10  %    CI:        68.83   %    70 - 86
                                                         FL/HC:      21.0   %    19.6 -
 HC:      271.5  mm     G. Age:  29w 4d         22  %    HC/AC:      1.07        0.99 -
 AC:      252.7  mm     G. Age:  29w 3d         45  %    FL/BPD:     80.9   %    71 - 87
 FL:         57  mm     G. Age:  29w 6d         48  %    FL/AC:      22.6   %    20 - 24

 Est. FW:    [3P]  gm      3 lb 2 oz     40  %
OB History

 Gravidity:    4         Term:   0         SAB:   3
 Living:       0
Gestational Age

 LMP:           32w 3d        Date:  [DATE]                 EDD:   [DATE]
 U/S Today:     29w 2d                                        EDD:   [DATE]
 Best:          29w 3d     Det. By:  Early Ultrasound         EDD:   [DATE]
                                     ([DATE])
Anatomy

 Cranium:               Appears normal         Aortic Arch:            Previously seen
 Cavum:                 Appears normal         Ductal Arch:            Previously seen
 Ventricles:            Appears normal         Stomach:                Appears normal, left
                                                                       sided
 Heart:                 Appears normal         Kidneys:                Appear normal
                        (4CH, axis, and
                        situs)
 RVOT:                  Appears normal         Bladder:                Appears normal
 LVOT:                  Appears normal
Impression

 Follow up growth suboxone exposure.
 Normal interval growth with measurements consistent with
 dates
 Good fetal movement and amniotic fluid volume
Recommendations

 Follow up growth in 4 weeks.

## 2020-12-03 ENCOUNTER — Other Ambulatory Visit: Payer: Self-pay | Admitting: *Deleted

## 2020-12-03 DIAGNOSIS — F1120 Opioid dependence, uncomplicated: Secondary | ICD-10-CM

## 2020-12-10 ENCOUNTER — Other Ambulatory Visit: Payer: Self-pay

## 2020-12-10 ENCOUNTER — Ambulatory Visit (INDEPENDENT_AMBULATORY_CARE_PROVIDER_SITE_OTHER): Payer: Medicaid Other | Admitting: Family Medicine

## 2020-12-10 ENCOUNTER — Telehealth: Payer: Self-pay | Admitting: Family Medicine

## 2020-12-10 VITALS — BP 113/68 | HR 114 | Wt 200.9 lb

## 2020-12-10 DIAGNOSIS — F1190 Opioid use, unspecified, uncomplicated: Secondary | ICD-10-CM

## 2020-12-10 DIAGNOSIS — O0990 Supervision of high risk pregnancy, unspecified, unspecified trimester: Secondary | ICD-10-CM

## 2020-12-10 DIAGNOSIS — Z3A31 31 weeks gestation of pregnancy: Secondary | ICD-10-CM

## 2020-12-10 MED ORDER — SUBOXONE 2-0.5 MG SL FILM: ORAL_FILM | SUBLINGUAL | 0 refills | Status: DC

## 2020-12-10 NOTE — Progress Notes (Signed)
   Subjective:  Dawn Huff is a 25 y.o. G4P0030 at [redacted]w[redacted]d being seen today for ongoing prenatal care.  She is currently monitored for the following issues for this high-risk pregnancy and has Supervision of high risk pregnancy, antepartum; Opioid use disorder; Rubella non-immune status, antepartum; and Syncope on their problem list.  Patient reports  left sided upper abdominal pain with greasy and fried foods .  Contractions: Not present. Vag. Bleeding: None.  Movement: Present. Denies leaking of fluid.   The following portions of the patient's history were reviewed and updated as appropriate: allergies, current medications, past family history, past medical history, past social history, past surgical history and problem list. Problem list updated.  Objective:   Vitals:   12/10/20 1526  BP: 113/68  Pulse: (!) 114  Weight: 200 lb 14.4 oz (91.1 kg)    Fetal Status: Fetal Heart Rate (bpm): 166   Movement: Present     General:  Alert, oriented and cooperative. Patient is in no acute distress.  Skin: Skin is warm and dry. No rash noted.   Cardiovascular: Bilateral edema  Respiratory: Normal respiratory effort, no problems with respiration noted  Abdomen: Soft, gravid, appropriate for gestational age. Pain/Pressure: Present     Pelvic: Vag. Bleeding: None     Cervical exam deferred        Extremities: Normal range of motion.  Edema: Trace  Mental Status: Normal mood and affect. Normal behavior. Normal judgment and thought content.    Assessment and Plan:  Pregnancy: G4P0030 at [redacted]w[redacted]d  1. Supervision of high risk pregnancy, antepartum 2. [redacted] weeks gestation of pregnancy Appropriate heart tones and fundal height. Has been having growth [place] for suboxone exposure. Last 9/2 EFW 40%ile. BP today normotensive at 113/68. Reports home BP cuff with some higher reads. Does not have log today. Will send in on MyChart. - follow up in 2 weeks  - Next growth [place] 9/30  2. Opioid use disorder On  Suboxone 2mg  in AM and 1mg  in PM. Denies signs of withdrawal - UDS today - Suboxone refilled for 2 weeks by Dr.  3. Left upper abdominal pain  Only with certain greasy foods. Will plan to avoid foods that are triggers. Continue Protonix  Preterm labor symptoms and general obstetric precautions including but not limited to vaginal bleeding, contractions, leaking of fluid and fetal movement were reviewed in detail with the patient. Please refer to After Visit Summary for other counseling recommendations.  Return in about 2 weeks (around 12/24/2020) for HROB, [facility] if possible or any other MD.  Crissie Reese, MD, MPH OB Fellow, Faculty Practice

## 2020-12-13 LAB — TOXASSURE SELECT 13 (MW), URINE

## 2020-12-26 ENCOUNTER — Other Ambulatory Visit: Payer: Self-pay

## 2020-12-26 ENCOUNTER — Other Ambulatory Visit: Payer: Self-pay | Admitting: Family Medicine

## 2020-12-26 DIAGNOSIS — F1190 Opioid use, unspecified, uncomplicated: Secondary | ICD-10-CM

## 2020-12-26 MED ORDER — SUBOXONE 2-0.5 MG SL FILM
ORAL_FILM | SUBLINGUAL | 0 refills | Status: DC
Start: 2020-12-26 — End: 2021-01-13

## 2020-12-27 ENCOUNTER — Ambulatory Visit: Payer: Medicaid Other

## 2020-12-31 ENCOUNTER — Encounter: Payer: Medicaid Other | Admitting: Advanced Practice Midwife

## 2020-12-31 ENCOUNTER — Encounter: Payer: Medicaid Other | Admitting: Obstetrics and Gynecology

## 2021-01-03 ENCOUNTER — Other Ambulatory Visit: Payer: Self-pay

## 2021-01-03 ENCOUNTER — Ambulatory Visit: Payer: Medicaid Other | Attending: Maternal & Fetal Medicine | Admitting: *Deleted

## 2021-01-03 ENCOUNTER — Ambulatory Visit (HOSPITAL_BASED_OUTPATIENT_CLINIC_OR_DEPARTMENT_OTHER): Payer: Medicaid Other

## 2021-01-03 VITALS — BP 117/66 | HR 113

## 2021-01-03 DIAGNOSIS — O99323 Drug use complicating pregnancy, third trimester: Secondary | ICD-10-CM | POA: Diagnosis not present

## 2021-01-03 DIAGNOSIS — O99330 Smoking (tobacco) complicating pregnancy, unspecified trimester: Secondary | ICD-10-CM | POA: Diagnosis not present

## 2021-01-03 DIAGNOSIS — O2623 Pregnancy care for patient with recurrent pregnancy loss, third trimester: Secondary | ICD-10-CM

## 2021-01-03 DIAGNOSIS — O09293 Supervision of pregnancy with other poor reproductive or obstetric history, third trimester: Secondary | ICD-10-CM | POA: Insufficient documentation

## 2021-01-03 DIAGNOSIS — Z3A34 34 weeks gestation of pregnancy: Secondary | ICD-10-CM

## 2021-01-03 DIAGNOSIS — F1120 Opioid dependence, uncomplicated: Secondary | ICD-10-CM | POA: Insufficient documentation

## 2021-01-03 IMAGING — US US MFM OB FOLLOW-UP
1 series · 14 of 28 positions shown · non-contrast
Comparison: none

[Series 1: us mfm ob follow-up · 44 acquisitions, 14 frames shown]
[im 2/44]
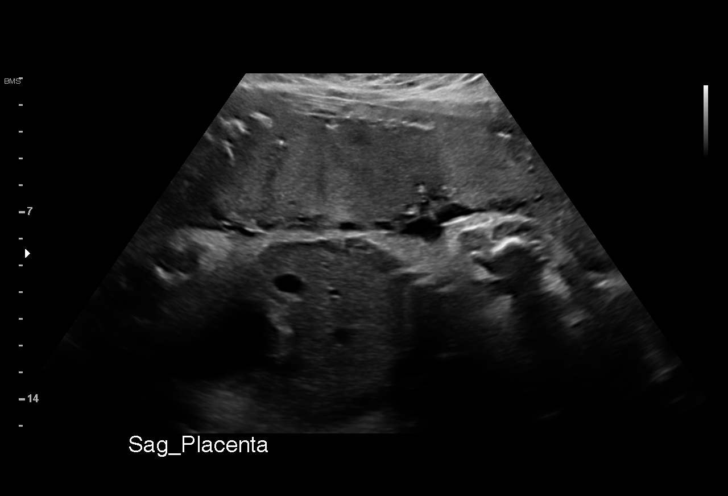
[im 5/44]
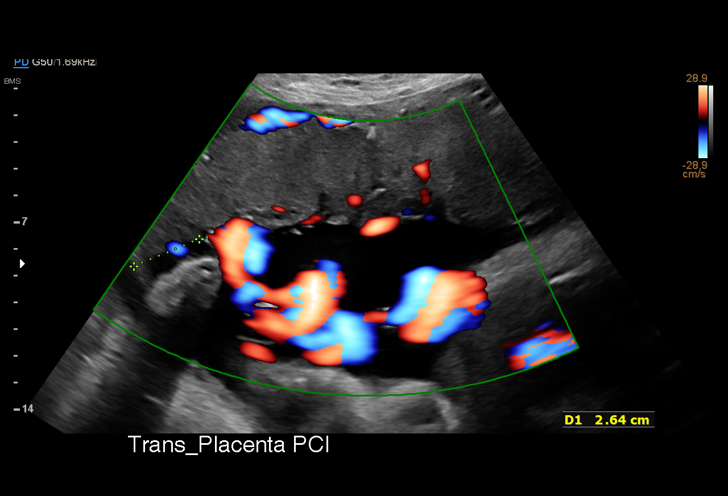
[im 8/44]
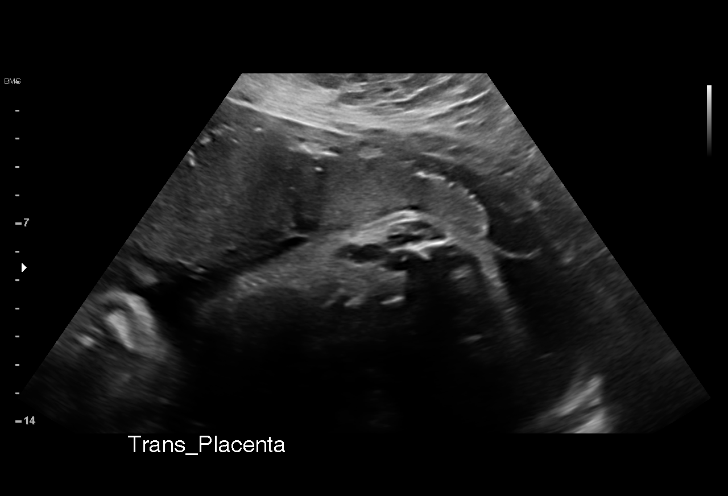
[im 12/44]
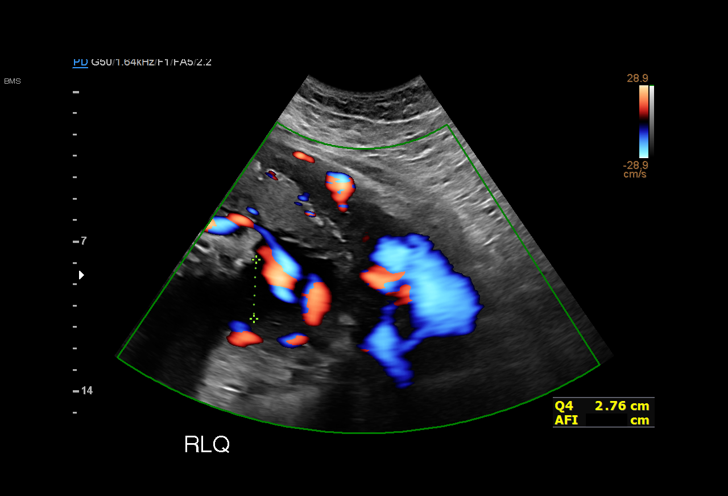
[im 15/44]
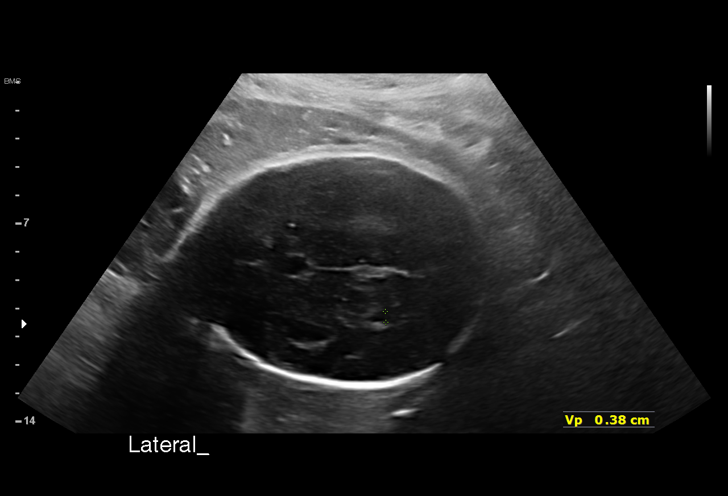
[im 18/44]
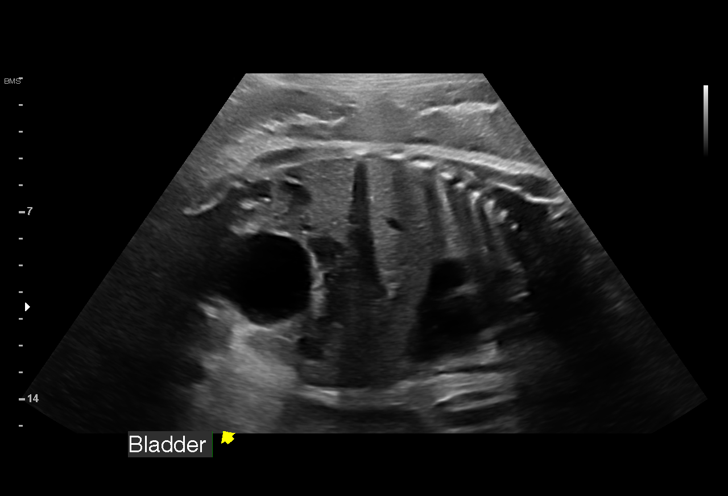
[im 21/44]
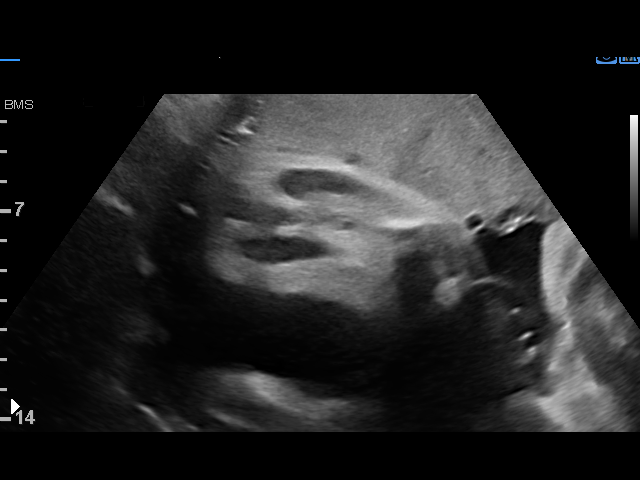
[im 24/44]
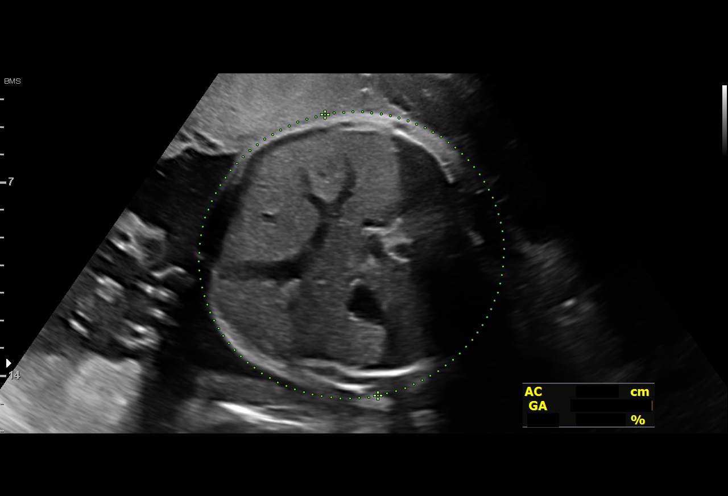
[im 28/44]
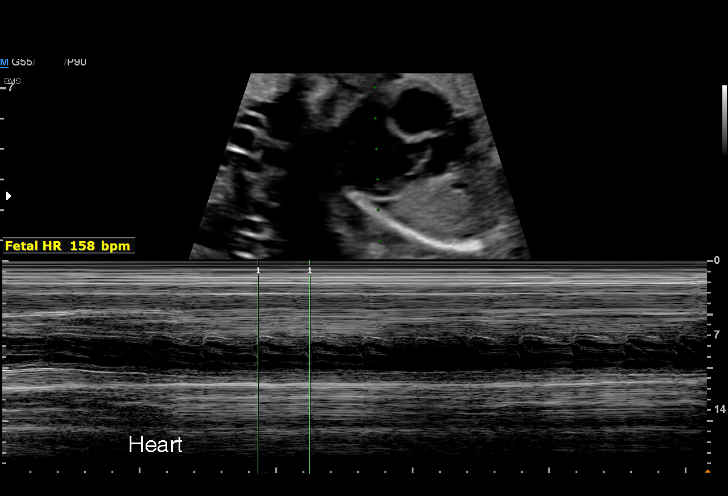
[im 31/44]
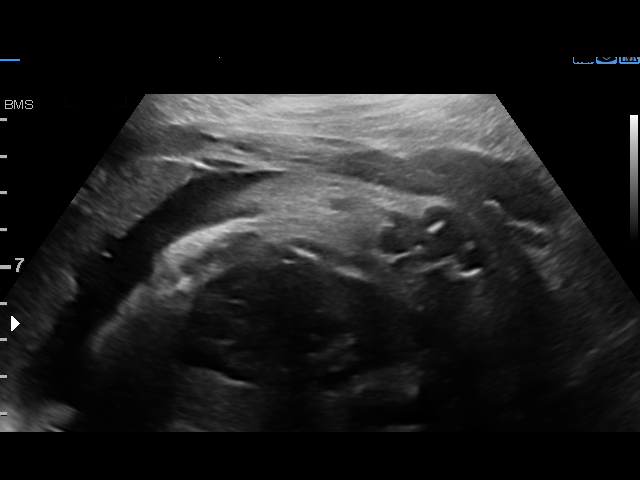
[im 34/44]
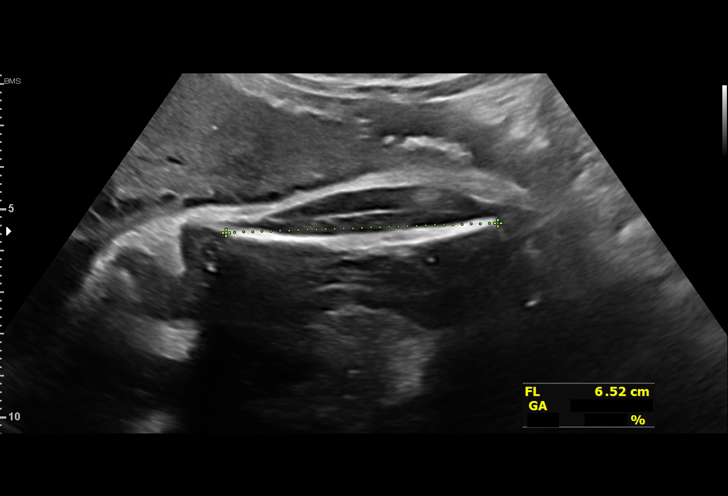
[im 37/44]
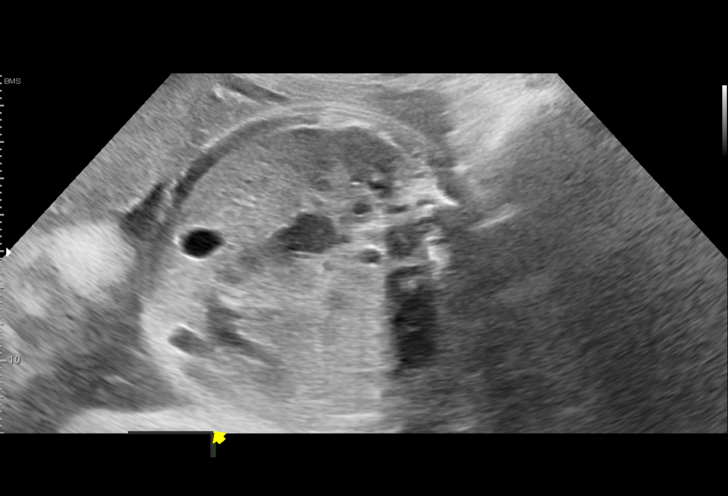
[im 40/44]
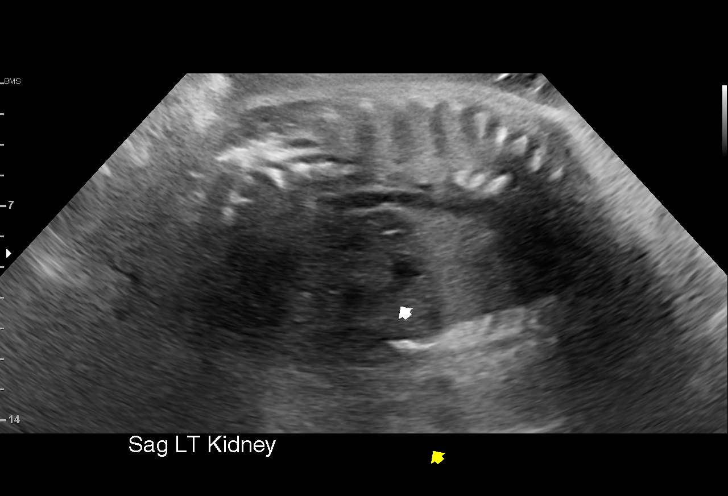
[im 44/44]
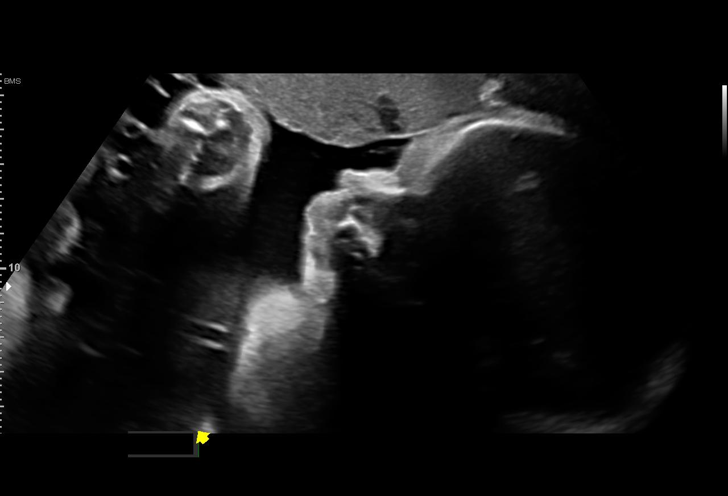

[14 of 28 positions shown; findings below may reference images not displayed]

NOMASIBULELE

Indications

 Poor obstetric history-Recurrent (habitual)    [HK]
 abortion (3 consecutive ab's)
 Suboxone use (on suboxone)                     [HK]
 Low lying placenta, antepartum (Resolved)      [HK]
 Antenatal follow-up for nonvisualized fetal    [HK]
 anatomy
 LR NIPS, Neg AFP, Neg Horizon
 34 weeks gestation of pregnancy
Fetal Evaluation

 Num Of Fetuses:         1
 Fetal Heart Rate(bpm):  158
 Cardiac Activity:       Observed
 Presentation:           Cephalic
 Placenta:               Anterior
 P. Cord Insertion:      Visualized

 Amniotic Fluid
 AFI FV:      Within normal limits

 AFI Sum(cm)     %Tile       Largest Pocket(cm)
 13.51           46

 RUQ(cm)       RLQ(cm)       LUQ(cm)        LLQ(cm)

Biometry

 BPD:      80.6  mm     G. Age:  32w 3d          5  %    CI:        65.75   %    70 - 86
                                                         FL/HC:      20.4   %    19.4 -
 HC:       319   mm     G. Age:  35w 6d         53  %    HC/AC:      0.94        0.96 -
 AC:      340.4  mm     G. Age:  38w 0d       > 99  %    FL/BPD:     80.6   %    71 - 87
 FL:         65  mm     G. Age:  33w 4d         19  %    FL/AC:      19.1   %    20 - 24
 HUM:      56.1  mm     G. Age:  32w 5d         24  %

 LV:        3.8  mm

 Est. FW:    [HK]  gm      6 lb 4 oz     87  %
OB History

 Gravidity:    4         Term:   0         SAB:   3
 Living:       0
Gestational Age

 LMP:           37w 3d        Date:  [DATE]                 EDD:   [DATE]
 U/S Today:     35w 0d                                        EDD:   [DATE]
 Best:          34w 3d     Det. By:  Early Ultrasound         EDD:   [DATE]
                                     ([DATE])
Anatomy

 Cranium:               Appears normal         Aortic Arch:            Previously seen
 Cavum:                 Previously seen        Ductal Arch:            Previously seen
 Ventricles:            Appears normal         Diaphragm:              Appears normal
 Choroid Plexus:        Previously seen        Stomach:                Appears normal, left
                                                                       sided
 Cerebellum:            Previously seen        Abdomen:                Appears normal
 Posterior Fossa:       Previously seen        Abdominal Wall:         Previously seen
 Nuchal Fold:           Previously seen        Cord Vessels:           Previously seen
 Face:                  Orbits and profile     Kidneys:                Appear normal
                        previously seen
 Lips:                  Previously seen        Bladder:                Appears normal
 Thoracic:              Appears normal         Spine:                  Previously seen
 Heart:                 Previously seen        Upper Extremities:      Previously seen
 RVOT:                  Previously seen        Lower Extremities:      Previously seen
 LVOT:                  Previously seen

 Other:  Heels, 5th digit, 3VV and 3VTV previously visualized. Fetus appears
         to be female.
Cervix Uterus Adnexa

 Cervix
 Not visualized (advanced GA >[HK])
Comments

 This patient was seen for a follow up growth scan due to
 maternal treatment with Suboxone.  She denies any problems
 since her last exam and reports that she has screened
 negative for gestational diabetes.
 She was informed that the fetal growth and amniotic fluid
 level appears appropriate for her gestational age.
 Fetal movements were noted throughout today's exam.
 As the fetal growth is within normal limits, no further exams
 were scheduled in our office.

## 2021-01-13 ENCOUNTER — Telehealth: Payer: Self-pay | Admitting: *Deleted

## 2021-01-13 ENCOUNTER — Ambulatory Visit (INDEPENDENT_AMBULATORY_CARE_PROVIDER_SITE_OTHER): Payer: Medicaid Other | Admitting: Obstetrics & Gynecology

## 2021-01-13 ENCOUNTER — Other Ambulatory Visit: Payer: Self-pay

## 2021-01-13 ENCOUNTER — Other Ambulatory Visit: Payer: Self-pay | Admitting: Family Medicine

## 2021-01-13 ENCOUNTER — Other Ambulatory Visit (HOSPITAL_COMMUNITY)
Admission: RE | Admit: 2021-01-13 | Discharge: 2021-01-13 | Disposition: A | Discharge status: Home / Self Care | Payer: Medicaid Other | Source: Ambulatory Visit | Attending: Advanced Practice Midwife | Admitting: Advanced Practice Midwife

## 2021-01-13 VITALS — BP 121/82 | HR 108 | Wt 211.0 lb

## 2021-01-13 DIAGNOSIS — Z3A35 35 weeks gestation of pregnancy: Secondary | ICD-10-CM

## 2021-01-13 DIAGNOSIS — F1190 Opioid use, unspecified, uncomplicated: Secondary | ICD-10-CM

## 2021-01-13 DIAGNOSIS — F1120 Opioid dependence, uncomplicated: Secondary | ICD-10-CM

## 2021-01-13 DIAGNOSIS — O99323 Drug use complicating pregnancy, third trimester: Secondary | ICD-10-CM

## 2021-01-13 DIAGNOSIS — O0990 Supervision of high risk pregnancy, unspecified, unspecified trimester: Secondary | ICD-10-CM | POA: Diagnosis present

## 2021-01-13 MED ORDER — CYCLOBENZAPRINE HCL 10 MG PO TABS: 10.0000 mg | ORAL_TABLET | Freq: Three times a day (TID) | ORAL | 3 refills | Status: DC | PRN

## 2021-01-13 MED ORDER — SUBOXONE 2-0.5 MG SL FILM: ORAL_FILM | SUBLINGUAL | 0 refills | Status: DC

## 2021-01-13 NOTE — Patient Instructions (Signed)
Return to office for any scheduled appointments. Call the office or go to the MAU at [facility] if:  You begin to have strong, frequent contractions  Your water breaks.  Sometimes it is a big gush of fluid, sometimes it is just a trickle that keeps getting your panties wet or running down your legs  You have vaginal bleeding.  It is normal to have a small amount of spotting if your cervix was checked.   You do not feel your baby moving like normal.  If you do not, get something to eat and drink and lay down and focus on feeling your baby move.   If your baby is still not moving like normal, you should call the office or go to MAU.  Any other obstetric concerns.   

## 2021-01-13 NOTE — Progress Notes (Signed)
   PRENATAL VISIT NOTE  Subjective:  Dawn Huff is a 25 y.o. G4P0030 at [redacted]w[redacted]d being seen today for ongoing prenatal care.  She is currently monitored for the following issues for this high-risk pregnancy and has Supervision of high risk pregnancy, antepartum; Opioid use disorder; Rubella non-immune status, antepartum; and Syncope on their problem list.  Patient reports  occasional back pain, desires Flexeril refill .  Contractions: Not present. Vag. Bleeding: None.  Movement: Present. Denies leaking of fluid.   The following portions of the patient's history were reviewed and updated as appropriate: allergies, current medications, past family history, past medical history, past social history, past surgical history and problem list.   Objective:   Vitals:   01/13/21 1054  BP: 121/82  Pulse: (!) 108  Weight: 211 lb (95.7 kg)    Fetal Status: Fetal Heart Rate (bpm): 161 Fundal Height: 36 cm Movement: Present  Presentation: Vertex  General:  Alert, oriented and cooperative. Patient is in no acute distress.  Skin: Skin is warm and dry. No rash noted.   Cardiovascular: Normal heart rate noted  Respiratory: Normal respiratory effort, no problems with respiration noted  Abdomen: Soft, gravid, appropriate for gestational age.  Pain/Pressure: Present     Pelvic: Cervical exam performed in the presence of a chaperone Dilation: Closed Effacement (%): Thick Station: Ballotable  Extremities: Normal range of motion.  Edema: Trace  Mental Status: Normal mood and affect. Normal behavior. Normal judgment and thought content.   Assessment and Plan:  Pregnancy: G4P0030 at [redacted]w[redacted]d 1. Suboxone maintenance treatment complicating pregnancy, antepartum, third trimester (HCC) 2. Opioid use disorder Doing well. Suboxone refilled. - SUBOXONE 2-0.5 MG FILM; Take 2 mg (one film) in the morning and 1 mg (half of one film) in the evening  Dispense: 23 each; Refill: 0  3. [redacted] weeks gestation of pregnancy 4.  Supervision of high risk pregnancy, antepartum Pelvic cultures done, Flexeril refilled - Cervicovaginal ancillary only( [place]) - Culture, beta strep (group b only) - cyclobenzaprine (FLEXERIL) 10 MG tablet; Take 1 tablet (10 mg total) by mouth every 8 (eight) hours as needed for muscle spasms.  Dispense: 30 tablet; Refill: 3  Preterm labor symptoms and general obstetric precautions including but not limited to vaginal bleeding, contractions, leaking of fluid and fetal movement were reviewed in detail with the patient. Please refer to After Visit Summary for other counseling recommendations.   Return in about 1 week (around 01/20/2021) for OFFICE OB VISIT (MD only).  No future appointments.  Jaynie Collins, MD

## 2021-01-13 NOTE — Telephone Encounter (Signed)
I called Dawn Huff to discuss her suboxone prescription. She states she has been notified it is ready for pick up at [facility]  I informed her it was sent to a different pharmacy but we will get it corrected.  I called Dr. Adrian Blackwater and Rx can be transferred to [facility] as verbal order.  I called [facility] and they will fill the RX as a verbal order and will call [facility] to cancel order there. They verified patient will get RX today. I called [facility] also and verified they are voiding their RX and are not filling it. Josedaniel Haye,RN

## 2021-01-14 LAB — CERVICOVAGINAL ANCILLARY ONLY
Chlamydia: NEGATIVE
Comment: NEGATIVE
Comment: NORMAL
Neisseria Gonorrhea: NEGATIVE

## 2021-01-17 LAB — CULTURE, BETA STREP (GROUP B ONLY): Strep Gp B Culture: NEGATIVE

## 2021-01-23 ENCOUNTER — Ambulatory Visit (INDEPENDENT_AMBULATORY_CARE_PROVIDER_SITE_OTHER): Payer: Medicaid Other | Admitting: Family Medicine

## 2021-01-23 ENCOUNTER — Other Ambulatory Visit: Payer: Self-pay

## 2021-01-23 ENCOUNTER — Telehealth: Payer: Self-pay | Admitting: Family Medicine

## 2021-01-23 VITALS — BP 117/80 | HR 116 | Wt 218.5 lb

## 2021-01-23 DIAGNOSIS — F1190 Opioid use, unspecified, uncomplicated: Secondary | ICD-10-CM

## 2021-01-23 DIAGNOSIS — O09899 Supervision of other high risk pregnancies, unspecified trimester: Secondary | ICD-10-CM

## 2021-01-23 DIAGNOSIS — Z2839 Other underimmunization status: Secondary | ICD-10-CM

## 2021-01-23 DIAGNOSIS — O0990 Supervision of high risk pregnancy, unspecified, unspecified trimester: Secondary | ICD-10-CM

## 2021-01-23 NOTE — Progress Notes (Signed)
PRENATAL VISIT NOTE  Subjective:  Dawn Huff is a 25 y.o. G4P0030 at 54w2dbeing seen today for ongoing prenatal care.  She is currently monitored for the following issues for this high-risk pregnancy and has Supervision of high risk pregnancy, antepartum; Opioid use disorder; Rubella non-immune status, antepartum; and Syncope on their problem list.  Patient reports no complaints.  Mother is accompanying her today. Contractions: Not present. Vag. Bleeding: None.  Movement: Present. Denies leaking of fluid.   The following portions of the patient's history were reviewed and updated as appropriate: allergies, current medications, past family history, past medical history, past social history, past surgical history and problem list.   Objective:   Vitals:   01/23/21 1613  BP: 117/80  Pulse: (!) 116  Weight: 218 lb 8 oz (99.1 kg)    Fetal Status: Fetal Heart Rate (bpm): 161   Movement: Present     General:  Alert, oriented and cooperative. Patient is in no acute distress.  Skin: Skin is warm and dry. No rash noted.   Cardiovascular: Normal heart rate noted  Respiratory: Normal respiratory effort, no problems with respiration noted  Abdomen: Soft, gravid, appropriate for gestational age.  Pain/Pressure: Absent     Pelvic: Cervical exam deferred        Extremities: Normal range of motion.  Edema: Trace  Mental Status: Normal mood and affect. Normal behavior. Normal judgment and thought content.   Assessment and Plan:  Pregnancy: [id number] 345w2d. Supervision of high risk pregnancy, antepartum Patient is asking about delivery plans and mother injects frequently.  I first reviewed timing of delivery with the recommendation for IOL at 41 wks due to rise in risk of stillbirth.  Leisha's mother accused me of using scare tactics on her daughter.  I calmly explained that I am not trying to scare the patient and trying to help her understand our recommendation. I also reviewed that suboxone  makes her pregnancy high risk.   Both also frustrated at having to see other physicians and not having Dr. EcDione Ploveror delivery. I discussed that we share care frequently and that none of our patients are guaranteed delivery by the provider that sees them most often.  I reviewed in detail the typical evidence based induction of labor with cervical ripening with foley balloon, Cytotec and then pitocin. Mother of patient was interjecting about how Marquerite "won't get a foley because that is what destroyed my cervix and why I delivered early." I provided active listening and explained that foley placement is one way to help with cervical ripening and that she can refuse this. I discussed that cytotec might be longer induction. Discussed she might still need pitocin.   I additionally discussed that patient can opt for membrane sweeping starting at 39 weeks to help prevent the need for IOL.  I reviewed the procedure and the risk/benefits.  Again Yazleemar's mother injected but Yvett explained I was saying this was an option and Naiara would like this.  Summary of lengthy (~40 minute discussion was) Emersen understands why we would recommend an IOL at 4192 weeksoe would like to have membrane sweeping if able at 39+ weeks Kenita would like cytotec only for cervical ripening  2. Opioid use disorder Has her suboxone   3. Rubella non-immune status, antepartum Needs PP MMR  Term labor symptoms and general obstetric precautions including but not limited to vaginal bleeding, contractions, leaking of fluid and fetal movement were reviewed in detail with the patient. Please  refer to After Visit Summary for other counseling recommendations.   Return in about 1 week (around 01/30/2021) for Routine prenatal care.  Future Appointments  Date Time Provider [place]  01/31/2021  8:35 AM Clarnce Flock, MD [facility]  02/06/2021  3:15 PM Caren Macadam, MD [facility]  02/14/2021  8:15 AM Anyanwu, Sallyanne Havers, MD  [facility]    Caren Macadam, MD

## 2021-01-23 NOTE — Telephone Encounter (Signed)
Open in error

## 2021-01-28 ENCOUNTER — Other Ambulatory Visit: Payer: Self-pay

## 2021-01-28 DIAGNOSIS — F1190 Opioid use, unspecified, uncomplicated: Secondary | ICD-10-CM

## 2021-01-28 DIAGNOSIS — F1120 Opioid dependence, uncomplicated: Secondary | ICD-10-CM

## 2021-01-28 DIAGNOSIS — O99323 Drug use complicating pregnancy, third trimester: Secondary | ICD-10-CM

## 2021-01-29 ENCOUNTER — Other Ambulatory Visit: Payer: Self-pay | Admitting: Family Medicine

## 2021-01-29 DIAGNOSIS — F1190 Opioid use, unspecified, uncomplicated: Secondary | ICD-10-CM

## 2021-01-29 DIAGNOSIS — F1120 Opioid dependence, uncomplicated: Secondary | ICD-10-CM

## 2021-01-29 MED ORDER — SUBOXONE 2-0.5 MG SL FILM: ORAL_FILM | SUBLINGUAL | 0 refills | Status: DC

## 2021-01-31 ENCOUNTER — Encounter: Payer: Self-pay | Admitting: Family Medicine

## 2021-01-31 ENCOUNTER — Other Ambulatory Visit: Payer: Self-pay

## 2021-01-31 ENCOUNTER — Other Ambulatory Visit: Payer: Self-pay | Admitting: Family Medicine

## 2021-01-31 ENCOUNTER — Ambulatory Visit (INDEPENDENT_AMBULATORY_CARE_PROVIDER_SITE_OTHER): Payer: Medicaid Other | Admitting: Family Medicine

## 2021-01-31 VITALS — BP 124/80 | HR 114 | Wt 225.4 lb

## 2021-01-31 DIAGNOSIS — Z2839 Other underimmunization status: Secondary | ICD-10-CM

## 2021-01-31 DIAGNOSIS — O0990 Supervision of high risk pregnancy, unspecified, unspecified trimester: Secondary | ICD-10-CM

## 2021-01-31 DIAGNOSIS — O09899 Supervision of other high risk pregnancies, unspecified trimester: Secondary | ICD-10-CM

## 2021-01-31 DIAGNOSIS — F1190 Opioid use, unspecified, uncomplicated: Secondary | ICD-10-CM

## 2021-01-31 MED ORDER — ZINC OXIDE 13 % EX CREA: TOPICAL_OINTMENT | Freq: Two times a day (BID) | CUTANEOUS | 0 refills | Status: DC

## 2021-01-31 NOTE — Progress Notes (Signed)
-  Nystatin cream

## 2021-01-31 NOTE — Progress Notes (Signed)
   Subjective:  Dawn Huff is a 25 y.o. G4P0030 at [redacted]w[redacted]d being seen today for ongoing prenatal care.  She is currently monitored for the following issues for this high-risk pregnancy and has Supervision of high risk pregnancy, antepartum; Opioid use disorder; Rubella non-immune status, antepartum; and Syncope on their problem list.  Patient reports no complaints.  Contractions: Not present. Vag. Bleeding: None.  Movement: Present. Denies leaking of fluid.   The following portions of the patient's history were reviewed and updated as appropriate: allergies, current medications, past family history, past medical history, past social history, past surgical history and problem list. Problem list updated.  Objective:   Vitals:   01/31/21 0840  BP: 124/80  Pulse: (!) 114  Weight: 225 lb 6.4 oz (102.2 kg)    Fetal Status: Fetal Heart Rate (bpm): 151   Movement: Present  Presentation: Vertex  General:  Alert, oriented and cooperative. Patient is in no acute distress.  Skin: Skin is warm and dry. No rash noted.   Cardiovascular: Normal heart rate noted  Respiratory: Normal respiratory effort, no problems with respiration noted  Abdomen: Soft, gravid, appropriate for gestational age. Pain/Pressure: Absent     Pelvic: Vag. Bleeding: None     Cervical exam performed Dilation: Closed Effacement (%): Thick Station: Ballotable  Extremities: Normal range of motion.  Edema: Trace  Mental Status: Normal mood and affect. Normal behavior. Normal judgment and thought content.   Urinalysis:      Assessment and Plan:  Pregnancy: G4P0030 at [redacted]w[redacted]d  1. Supervision of high risk pregnancy, antepartum BP and FHR normal  2. Opioid use disorder Stable on $RemoveB'2mg'ZEFxyTYs$  qAM and 1 mg qPM UDS today  3. Rubella non-immune status, antepartum Offer MMR PP  Term labor symptoms and general obstetric precautions including but not limited to vaginal bleeding, contractions, leaking of fluid and fetal movement were reviewed  in detail with the patient. Please refer to After Visit Summary for other counseling recommendations.  Return for [facility], ob visit, needs MD.   Clarnce Flock, MD

## 2021-02-06 ENCOUNTER — Other Ambulatory Visit: Payer: Self-pay

## 2021-02-06 ENCOUNTER — Ambulatory Visit (INDEPENDENT_AMBULATORY_CARE_PROVIDER_SITE_OTHER): Payer: Medicaid Other | Admitting: Family Medicine

## 2021-02-06 VITALS — BP 125/83 | HR 103 | Wt 221.0 lb

## 2021-02-06 DIAGNOSIS — O0990 Supervision of high risk pregnancy, unspecified, unspecified trimester: Secondary | ICD-10-CM

## 2021-02-06 DIAGNOSIS — F1190 Opioid use, unspecified, uncomplicated: Secondary | ICD-10-CM

## 2021-02-06 LAB — TOXASSURE SELECT 13 (MW), URINE

## 2021-02-06 NOTE — Progress Notes (Signed)
   PRENATAL VISIT NOTE  Subjective:  Dawn Huff is a 25 y.o. G4P0030 at [redacted]w[redacted]d being seen today for ongoing prenatal care.  She is currently monitored for the following issues for this high-risk pregnancy and has Supervision of high risk pregnancy, antepartum; Opioid use disorder; Rubella non-immune status, antepartum; and Syncope on their problem list.  Patient reports no complaints.  Contractions: Not present. Vag. Bleeding: None.  Movement: Present. Denies leaking of fluid.   The following portions of the patient's history were reviewed and updated as appropriate: allergies, current medications, past family history, past medical history, past social history, past surgical history and problem list.   Objective:   Vitals:   02/06/21 1600  BP: 125/83  Pulse: (!) 103  Weight: 221 lb (100.2 kg)    Fetal Status: Fetal Heart Rate (bpm): 145   Movement: Present     General:  Alert, oriented and cooperative. Patient is in no acute distress.  Skin: Skin is warm and dry. No rash noted.   Cardiovascular: Normal heart rate noted  Respiratory: Normal respiratory effort, no problems with respiration noted  Abdomen: Soft, gravid, appropriate for gestational age.  Pain/Pressure: Present     Pelvic: Cervical exam performed in the presence of a chaperone        Extremities: Normal range of motion.  Edema: Trace  Mental Status: Normal mood and affect. Normal behavior. Normal judgment and thought content.   Assessment and Plan:  Pregnancy: G4P0030 at [redacted]w[redacted]d 1. Supervision of high risk pregnancy, antepartum Here with friend and we discussed that Dawn Huff is allowed to have anyone in the LD room she wants but only 2 spots. She is worried about FOB forcing himself into the room and I reassured he is not "entitled" to be there and she gets to decide who is in her birth room  Reviewed ways to help bring about labor Continues to decline scheduling IOL at 41 wks and voiced understanding about why this is our  recommendation. She will be [redacted]w[redacted]d at next appt and understands we will recommend an induction for stillbirth prevention. Discussed she could choose to have antenatal testing at [redacted]w[redacted]d and IOL at 41w3  Patient would like to be unmedicated for her birth and plans to move. She asked about standing up to deliver and I reviewed our practice and hospital supports movement in labor.   2. Opioid use disorder Confirmed pharmacy received Suboxone on 11/2 from Dr. Adrian Blackwater  Term labor symptoms and general obstetric precautions including but not limited to vaginal bleeding, contractions, leaking of fluid and fetal movement were reviewed in detail with the patient. Please refer to After Visit Summary for other counseling recommendations.   Return in about 1 week (around 02/13/2021) for Routine prenatal care.  Future Appointments  Date Time Provider Department Center  02/14/2021  8:15 AM Anyanwu, Jethro Bastos, MD [facility]    Federico Flake, MD

## 2021-02-07 ENCOUNTER — Encounter: Payer: Medicaid Other | Admitting: Obstetrics & Gynecology

## 2021-02-13 ENCOUNTER — Inpatient Hospital Stay (HOSPITAL_COMMUNITY)
Admission: AD | Admit: 2021-02-13 | Discharge: 2021-02-13 | Disposition: A | Discharge status: Home / Self Care | Payer: Medicaid Other | Attending: Obstetrics and Gynecology | Admitting: Obstetrics and Gynecology

## 2021-02-13 ENCOUNTER — Other Ambulatory Visit: Payer: Self-pay

## 2021-02-13 ENCOUNTER — Encounter: Payer: Self-pay | Admitting: Family Medicine

## 2021-02-13 ENCOUNTER — Encounter (HOSPITAL_COMMUNITY): Payer: Self-pay | Admitting: Obstetrics and Gynecology

## 2021-02-13 DIAGNOSIS — O26893 Other specified pregnancy related conditions, third trimester: Secondary | ICD-10-CM | POA: Diagnosis not present

## 2021-02-13 DIAGNOSIS — Z5329 Procedure and treatment not carried out because of patient's decision for other reasons: Secondary | ICD-10-CM | POA: Insufficient documentation

## 2021-02-13 DIAGNOSIS — Z3A40 40 weeks gestation of pregnancy: Secondary | ICD-10-CM | POA: Insufficient documentation

## 2021-02-13 DIAGNOSIS — R030 Elevated blood-pressure reading, without diagnosis of hypertension: Secondary | ICD-10-CM | POA: Insufficient documentation

## 2021-02-13 DIAGNOSIS — Z0371 Encounter for suspected problem with amniotic cavity and membrane ruled out: Secondary | ICD-10-CM | POA: Diagnosis present

## 2021-02-13 DIAGNOSIS — O480 Post-term pregnancy: Secondary | ICD-10-CM | POA: Insufficient documentation

## 2021-02-13 LAB — CBC
HCT: 32.6 % — ABNORMAL LOW (ref 36.0–46.0)
Hemoglobin: 11.2 g/dL — ABNORMAL LOW (ref 12.0–15.0)
MCH: 31.9 pg (ref 26.0–34.0)
MCHC: 34.4 g/dL (ref 30.0–36.0)
MCV: 92.9 fL (ref 80.0–100.0)
Platelets: 247 10*3/uL (ref 150–400)
RBC: 3.51 MIL/uL — ABNORMAL LOW (ref 3.87–5.11)
RDW: 13.8 % (ref 11.5–15.5)
WBC: 10.9 10*3/uL — ABNORMAL HIGH (ref 4.0–10.5)
nRBC: 0 % (ref 0.0–0.2)

## 2021-02-13 LAB — COMPREHENSIVE METABOLIC PANEL
ALT: 15 U/L (ref 0–44)
AST: 15 U/L (ref 15–41)
Albumin: 2.4 g/dL — ABNORMAL LOW (ref 3.5–5.0)
Alkaline Phosphatase: 157 U/L — ABNORMAL HIGH (ref 38–126)
Anion gap: 8 (ref 5–15)
BUN: <5 mg/dL — ABNORMAL LOW (ref 6–20)
CO2: 22 mmol/L (ref 22–32)
Calcium: 8.6 mg/dL — ABNORMAL LOW (ref 8.9–10.3)
Chloride: 108 mmol/L (ref 98–111)
Creatinine, Ser: 0.6 mg/dL (ref 0.44–1.00)
GFR, Estimated: >60 mL/min (ref >60)
Glucose, Bld: 99 mg/dL (ref 70–99)
Potassium: 3.4 mmol/L — ABNORMAL LOW (ref 3.5–5.1)
Sodium: 138 mmol/L (ref 135–145)
Total Bilirubin: 0.3 mg/dL (ref 0.3–1.2)
Total Protein: 5.1 g/dL — ABNORMAL LOW (ref 6.5–8.1)

## 2021-02-13 LAB — PROTEIN / CREATININE RATIO, URINE
Creatinine, Urine: 61.12 mg/dL
Protein Creatinine Ratio: 0.25 mg/mg{Cre} — ABNORMAL HIGH (ref 0.00–0.15)
Total Protein, Urine: 15 mg/dL

## 2021-02-13 LAB — POCT FERN TEST: POCT Fern Test: NEGATIVE

## 2021-02-13 NOTE — MAU Note (Signed)
Patient left AMA.  Signed paperwork. Notified Thalia Bloodgood, CNM.

## 2021-02-13 NOTE — MAU Note (Signed)
Dawn Huff is a 25 y.o. at [redacted]w[redacted]d here in MAU reporting: had been leaking clear fluid since about 0430 this morning. Intermittent leaking. Having some contractions about every 15-20 minutes. No bleeding. +FM.  Onset of complaint: today  Pain score: 3/10  Vitals:   02/13/21 1609  BP: (!) 142/77  Pulse: (!) 103  Resp: 18  Temp: 98.1 F (36.7 C)  SpO2: 97%     FHT:139  Lab orders placed from triage: none

## 2021-02-13 NOTE — MAU Provider Note (Signed)
History     CSN: [id number]  Arrival date and time: 02/13/21 1558   Event Date/Time   First Provider Initiated Contact with Patient 02/13/21 1718      Chief Complaint  Patient presents with   Contractions   Rupture of Membranes   HPI Dawn Huff is a 25 y.o. G4P0030 at [redacted]w[redacted]d who presents to MAU with concern for ROM. She endorses a gush of fluid at 0430 with continuous leaking of fluid since that time. She endorses lower abdominal contractions q 15-20 min. Pain score is 3/10. She does not believe she is in labor.   Patient endorses history of elevated blood pressures on her home cuff. She states she sees consistent systolic readings in the 235T but is then normotensive during her appointments. She denies headache, visual disturbances, RUQ/epigastric pain, new onset swelling or weight gain.   Patient receives care with [facility].   OB History     Gravida  4   Para  0   Term  0   Preterm  0   AB  3   Living  0      SAB  3   IAB  0   Ectopic  0   Multiple  0   Live Births              Past Medical History:  Diagnosis Date   ADHD (attention deficit hyperactivity disorder)    Contraceptive management 03/13/2013   IBS (irritable bowel syndrome)    Irregular bleeding 03/10/2013   Lactose intolerance    Low-lying placenta 09/23/2020   1.4 cm from os on initial anatomy scan Resolved 10/18/20    History reviewed. No pertinent surgical history.  Family History  Problem Relation Age of Onset   Hypertension Maternal Grandmother    Diabetes Maternal Grandmother     Social History   Tobacco Use   Smoking status: Every Day    Packs/day: 0.50    Years: 6.00    Pack years: 3.00    Types: Cigarettes   Smokeless tobacco: Never  Vaping Use   Vaping Use: Every day   Substances: Nicotine-salt  Substance Use Topics   Alcohol use: Not Currently    Comment: occ   Drug use: Not Currently    Types: Marijuana, Heroin, Methamphetamines    Comment: last used 8.5  months ago    Allergies:  Allergies  Allergen Reactions   Lactose Intolerance (Gi)     Medications Prior to Admission  Medication Sig Dispense Refill Last Dose   cyclobenzaprine (FLEXERIL) 10 MG tablet Take 1 tablet (10 mg total) by mouth every 8 (eight) hours as needed for muscle spasms. 30 tablet 3 02/13/2021   pantoprazole (PROTONIX) 20 MG tablet Take 1 tablet (20 mg total) by mouth daily. 30 tablet 5 02/12/2021   Prenatal Vit-Fe Fumarate-FA (M-NATAL PLUS) 27-1 MG TABS Take 1 tablet by mouth daily. 30 tablet 11 02/13/2021   promethazine (PHENERGAN) 25 MG tablet Take 1 tablet (25 mg total) by mouth every 6 (six) hours as needed for nausea or vomiting. 30 tablet 2 02/12/2021   SUBOXONE 2-0.5 MG FILM Take 2 mg (one film) in the morning and 1 mg (half of one film) in the evening 42 each 0 02/13/2021   Blood Pressure Monitoring (BLOOD PRESSURE KIT) DEVI 1 Device by Does not apply route as needed. 1 each 0    mupirocin 2% oint-hydrocortisone 2.5% cream-nystatin cream-zinc oxide 13% oint 1:1:1:5 mixture Apply topically 2 (two) times daily. [place]  g 0     Review of Systems  Gastrointestinal:  Positive for abdominal pain.  Genitourinary:  Positive for vaginal discharge.  All other systems reviewed and are negative. Physical Exam   Blood pressure 118/64, pulse (!) 107, temperature 98.1 F (36.7 C), temperature source Oral, resp. rate 18, height 5' (1.524 m), weight 104.5 kg, last menstrual period 04/16/2020, SpO2 99 %.  Physical Exam Vitals and nursing note reviewed. Exam conducted with a chaperone present.  Constitutional:      General: She is not in acute distress.    Appearance: Normal appearance. She is not ill-appearing.  Cardiovascular:     Rate and Rhythm: Normal rate and regular rhythm.     Pulses: Normal pulses.     Heart sounds: Normal heart sounds.  Pulmonary:     Effort: Pulmonary effort is normal.     Breath sounds: Normal breath sounds.  Abdominal:     Comments: Gravid   Skin:    Capillary Refill: Capillary refill takes less than 2 seconds.  Neurological:     Mental Status: She is alert and oriented to person, place, and time.  Psychiatric:        Mood and Affect: Mood normal.        Behavior: Behavior normal.        Thought Content: Thought content normal.        Judgment: Judgment normal.    MAU Course/MDM  Procedures: sterile speculum exam for rule out rupture  --Intact amniotic sac, negative pooling, negative fern --Reactive NST: baseline 140, mod var, + 15 x 15 accels, no decels --Toco: occasional contraction with UI --Cervix checked per patient request: 1.5/thick/-3, vertex by suture, unchanged from previous --Prolonged discussion at bedside regarding elevated BP in setting of gestational age, indication for labs, tracing with exactly two true 15 x 15 accels, recommending admission or reconsideration of admission after prolonged monitoring or BP labs.Patient initially agreeable to immediate admission. CNM discussed with labor team, who agreed patient was appropriate for admission. On return to bedside patient's mom on phone, patient now verbalizing intention to leave but states she will attend her next OB appointment tomorrow  Patient Vitals for the past 24 hrs:  BP Temp Temp src Pulse Resp SpO2 Height Weight  02/13/21 1745 118/64 -- -- (!) 107 -- 99 % -- --  02/13/21 1715 126/67 -- -- 94 -- 97 % -- --  02/13/21 1700 126/75 -- -- (!) 105 -- 98 % -- --  02/13/21 1645 117/74 -- -- (!) 103 -- 98 % -- --  02/13/21 1630 121/72 -- -- (!) 105 -- 96 % -- --  02/13/21 1620 121/88 98.1 F (36.7 C) Oral (!) 110 18 96 % -- --  02/13/21 1609 (!) 142/77 98.1 F (36.7 C) Oral (!) 103 18 97 % -- --  02/13/21 1605 -- -- -- -- -- -- 5' (1.524 m) 104.5 kg   Results for orders placed or performed during the hospital encounter of 02/13/21 (from the past 24 hour(s))  POCT fern test     Status: None   Collection Time: 02/13/21  5:17 PM  Result Value Ref Range    POCT Fern Test Negative = intact amniotic membranes   Protein / creatinine ratio, urine     Status: Abnormal   Collection Time: 02/13/21  5:17 PM  Result Value Ref Range   Creatinine, Urine 61.12 mg/dL   Total Protein, Urine 15 mg/dL   Protein Creatinine Ratio 0.25 (H) 0.00 -  0.15 mg/mg[Cre]   Assessment and Plan  --25 y.o. [id number] at [redacted]w[redacted]d --Elevated BP x 1, P:Cr 0.25 --Reactive tracing --Intact amniotic sac --Patient left AMA after having PEC labs collected  SDarlina Rumpf CNM 02/13/2021, 7:54 PM

## 2021-02-14 ENCOUNTER — Encounter: Payer: Self-pay | Admitting: Obstetrics & Gynecology

## 2021-02-14 ENCOUNTER — Ambulatory Visit (INDEPENDENT_AMBULATORY_CARE_PROVIDER_SITE_OTHER): Payer: Medicaid Other | Admitting: Obstetrics & Gynecology

## 2021-02-14 VITALS — BP 130/84 | HR 114 | Wt 227.5 lb

## 2021-02-14 DIAGNOSIS — O0990 Supervision of high risk pregnancy, unspecified, unspecified trimester: Secondary | ICD-10-CM

## 2021-02-14 DIAGNOSIS — Z3A40 40 weeks gestation of pregnancy: Secondary | ICD-10-CM

## 2021-02-14 DIAGNOSIS — O480 Post-term pregnancy: Secondary | ICD-10-CM

## 2021-02-14 NOTE — Progress Notes (Signed)
   PRENATAL VISIT NOTE  Subjective:  Dawn Huff is a 25 y.o. G4P0030 at [redacted]w[redacted]d being seen today for ongoing prenatal care.  She is currently monitored for the following issues for this high-risk pregnancy and has Supervision of high risk pregnancy, antepartum; Opioid use disorder; Rubella non-immune status, antepartum; and Syncope on their problem list.  Patient reports no complaints. Was seen in MAU yesterday, not found to be in labor, had reactive NST.   Contractions: Irregular. Vag. Bleeding: None.  Movement: Present. Denies leaking of fluid.   The following portions of the patient's history were reviewed and updated as appropriate: allergies, current medications, past family history, past medical history, past social history, past surgical history and problem list.   Objective:   Vitals:   02/14/21 0831  BP: 130/84  Pulse: (!) 114  Weight: 227 lb 8 oz (103.2 kg)    Fetal Status: Fetal Heart Rate (bpm): 156   Movement: Present     General:  Alert, oriented and cooperative. Patient is in no acute distress.  Skin: Skin is warm and dry. No rash noted.   Cardiovascular: Normal heart rate noted  Respiratory: Normal respiratory effort, no problems with respiration noted  Abdomen: Soft, gravid, appropriate for gestational age.  Pain/Pressure: Present     Pelvic: Cervical exam deferred        Extremities: Normal range of motion.  Edema: Mild pitting, slight indentation  Mental Status: Normal mood and affect. Normal behavior. Normal judgment and thought content.   Assessment and Plan:  Pregnancy: G4P0030 at [redacted]w[redacted]d 1. Post term pregnancy over 40 weeks 2. [redacted] weeks gestation of pregnancy 3. Supervision of high risk pregnancy, antepartum Patient had reactive NST during MAU visit last evening. Was offered IOL she declined. Still declines to be scheduled for IOL. Reiterated risks of stillbirth, patient verbalized understanding of these risks. Also discussed concerns about borderline BPs and  further increase in maternal-fetal morbidity. Discussed recommendations about cervical ripening at home, she says she will try these. Will schedule for postdates testing next week Continue close monitoring. Term labor symptoms and general obstetric precautions including but not limited to vaginal bleeding, contractions, leaking of fluid and fetal movement were reviewed in detail with the patient. Please refer to After Visit Summary for other counseling recommendations.   Return in about 4 days (around 02/18/2021) for Needs RN Visit for NST only (as per Diane - must be 11/22) .  Future Appointments  Date Time Provider Department Center  02/18/2021 10:00 AM [facility] NURSE [facility]    Jaynie Collins, MD

## 2021-02-14 NOTE — Patient Instructions (Signed)
Return to office for any scheduled appointments. Call the office or go to the MAU at [facility] if:  You begin to have strong, frequent contractions  Your water breaks.  Sometimes it is a big gush of fluid, sometimes it is just a trickle that keeps getting your panties wet or running down your legs  You have vaginal bleeding.  It is normal to have a small amount of spotting if your cervix was checked.   You do not feel your baby moving like normal.  If you do not, get something to eat and drink and lay down and focus on feeling your baby move.   If your baby is still not moving like normal, you should call the office or go to MAU.  Any other obstetric concerns.   

## 2021-02-15 ENCOUNTER — Encounter (HOSPITAL_COMMUNITY): Payer: Self-pay | Admitting: Family Medicine

## 2021-02-15 ENCOUNTER — Inpatient Hospital Stay (HOSPITAL_COMMUNITY)
Admission: AD | Admit: 2021-02-15 | Discharge: 2021-02-19 | DRG: 786 | Disposition: A | Discharge status: Home / Self Care | Payer: Medicaid Other | Attending: Family Medicine | Admitting: Family Medicine

## 2021-02-15 DIAGNOSIS — F17210 Nicotine dependence, cigarettes, uncomplicated: Secondary | ICD-10-CM | POA: Diagnosis present

## 2021-02-15 DIAGNOSIS — F1120 Opioid dependence, uncomplicated: Secondary | ICD-10-CM | POA: Diagnosis present

## 2021-02-15 DIAGNOSIS — O99334 Smoking (tobacco) complicating childbirth: Secondary | ICD-10-CM | POA: Diagnosis present

## 2021-02-15 DIAGNOSIS — O9081 Anemia of the puerperium: Secondary | ICD-10-CM | POA: Diagnosis not present

## 2021-02-15 DIAGNOSIS — F1190 Opioid use, unspecified, uncomplicated: Secondary | ICD-10-CM | POA: Diagnosis present

## 2021-02-15 DIAGNOSIS — D62 Acute posthemorrhagic anemia: Secondary | ICD-10-CM | POA: Diagnosis not present

## 2021-02-15 DIAGNOSIS — O99324 Drug use complicating childbirth: Secondary | ICD-10-CM | POA: Diagnosis present

## 2021-02-15 DIAGNOSIS — Z23 Encounter for immunization: Secondary | ICD-10-CM

## 2021-02-15 DIAGNOSIS — O1205 Gestational edema, complicating the puerperium: Secondary | ICD-10-CM | POA: Diagnosis not present

## 2021-02-15 DIAGNOSIS — R55 Syncope and collapse: Secondary | ICD-10-CM | POA: Diagnosis present

## 2021-02-15 DIAGNOSIS — O411290 Chorioamnionitis, unspecified trimester, not applicable or unspecified: Secondary | ICD-10-CM

## 2021-02-15 DIAGNOSIS — Z3A40 40 weeks gestation of pregnancy: Secondary | ICD-10-CM | POA: Diagnosis not present

## 2021-02-15 DIAGNOSIS — O99214 Obesity complicating childbirth: Secondary | ICD-10-CM | POA: Diagnosis present

## 2021-02-15 DIAGNOSIS — O26893 Other specified pregnancy related conditions, third trimester: Secondary | ICD-10-CM | POA: Diagnosis present

## 2021-02-15 DIAGNOSIS — O411230 Chorioamnionitis, third trimester, not applicable or unspecified: Secondary | ICD-10-CM | POA: Diagnosis present

## 2021-02-15 DIAGNOSIS — Z2839 Other underimmunization status: Secondary | ICD-10-CM

## 2021-02-15 DIAGNOSIS — O480 Post-term pregnancy: Secondary | ICD-10-CM | POA: Diagnosis present

## 2021-02-15 DIAGNOSIS — O165 Unspecified maternal hypertension, complicating the puerperium: Secondary | ICD-10-CM

## 2021-02-15 DIAGNOSIS — Z20822 Contact with and (suspected) exposure to covid-19: Secondary | ICD-10-CM | POA: Diagnosis present

## 2021-02-15 DIAGNOSIS — O09899 Supervision of other high risk pregnancies, unspecified trimester: Secondary | ICD-10-CM

## 2021-02-15 LAB — RESP PANEL BY RT-PCR (FLU A&B, COVID) ARPGX2
Influenza A by PCR: NEGATIVE
Influenza B by PCR: NEGATIVE
SARS Coronavirus 2 by RT PCR: NEGATIVE

## 2021-02-15 LAB — CBC
HCT: 35 % — ABNORMAL LOW (ref 36.0–46.0)
Hemoglobin: 11.7 g/dL — ABNORMAL LOW (ref 12.0–15.0)
MCH: 31.4 pg (ref 26.0–34.0)
MCHC: 33.4 g/dL (ref 30.0–36.0)
MCV: 93.8 fL (ref 80.0–100.0)
Platelets: 242 10*3/uL (ref 150–400)
RBC: 3.73 MIL/uL — ABNORMAL LOW (ref 3.87–5.11)
RDW: 13.9 % (ref 11.5–15.5)
WBC: 24.2 10*3/uL — ABNORMAL HIGH (ref 4.0–10.5)
nRBC: 0 % (ref 0.0–0.2)

## 2021-02-15 LAB — RAPID URINE DRUG SCREEN, HOSP PERFORMED
Amphetamines: NOT DETECTED
Barbiturates: NOT DETECTED
Benzodiazepines: NOT DETECTED
Cocaine: NOT DETECTED
Opiates: NOT DETECTED
Tetrahydrocannabinol: NOT DETECTED

## 2021-02-15 LAB — TYPE AND SCREEN
ABO/RH(D): O POS
Antibody Screen: NEGATIVE

## 2021-02-15 MED ORDER — FENTANYL-BUPIVACAINE-NACL 0.5-0.125-0.9 MG/250ML-% EP SOLN
12.0000 mL/h | EPIDURAL | Status: DC | PRN
  Administered 2021-02-16: 12 mL/h via EPIDURAL
  Filled 2021-02-15: qty 250

## 2021-02-15 MED ORDER — OXYTOCIN-SODIUM CHLORIDE 30-0.9 UT/500ML-% IV SOLN
2.5000 [IU]/h | INTRAVENOUS | Status: DC
  Filled 2021-02-15: qty 500

## 2021-02-15 MED ORDER — BUPRENORPHINE HCL-NALOXONE HCL 2-0.5 MG SL SUBL
2.0000 | SUBLINGUAL_TABLET | Freq: Every day | SUBLINGUAL | Status: DC
  Administered 2021-02-15: 2 via SUBLINGUAL
  Filled 2021-02-15: qty 2

## 2021-02-15 MED ORDER — LACTATED RINGERS IV SOLN
500.0000 mL | Freq: Once | INTRAVENOUS | Status: AC
  Administered 2021-02-16: 500 mL via INTRAVENOUS

## 2021-02-15 MED ORDER — DIPHENHYDRAMINE HCL 50 MG/ML IJ SOLN: 12.5000 mg | INTRAMUSCULAR | Status: DC | PRN

## 2021-02-15 MED ORDER — EPHEDRINE 5 MG/ML INJ: 10.0000 mg | INTRAVENOUS | Status: DC | PRN

## 2021-02-15 MED ORDER — SODIUM CHLORIDE 0.9 % IV SOLN
2.0000 g | Freq: Four times a day (QID) | INTRAVENOUS | Status: DC
Start: 2021-02-15 — End: 2021-02-16
  Administered 2021-02-15: 2 g via INTRAVENOUS
  Filled 2021-02-15: qty 2000

## 2021-02-15 MED ORDER — LACTATED RINGERS IV SOLN
INTRAVENOUS | Status: DC
  Administered 2021-02-16: 1000 mL via INTRAVENOUS

## 2021-02-15 MED ORDER — ONDANSETRON HCL 4 MG/2ML IJ SOLN: 4.0000 mg | Freq: Four times a day (QID) | INTRAMUSCULAR | Status: DC | PRN

## 2021-02-15 MED ORDER — BUPRENORPHINE HCL-NALOXONE HCL 8-2 MG SL SUBL
1.0000 | SUBLINGUAL_TABLET | SUBLINGUAL | Status: DC
  Filled 2021-02-15 (×2): qty 2

## 2021-02-15 MED ORDER — GENTAMICIN SULFATE 40 MG/ML IJ SOLN
5.0000 mg/kg | INTRAVENOUS | Status: DC
  Administered 2021-02-15: 340 mg via INTRAVENOUS
  Filled 2021-02-15: qty 8.5

## 2021-02-15 MED ORDER — ACETAMINOPHEN 325 MG PO TABS: 650.0000 mg | ORAL_TABLET | ORAL | Status: DC | PRN

## 2021-02-15 MED ORDER — OXYCODONE-ACETAMINOPHEN 5-325 MG PO TABS: 2.0000 | ORAL_TABLET | ORAL | Status: DC | PRN

## 2021-02-15 MED ORDER — LIDOCAINE HCL (PF) 1 % IJ SOLN: 30.0000 mL | INTRAMUSCULAR | Status: DC | PRN

## 2021-02-15 MED ORDER — FENTANYL CITRATE (PF) 100 MCG/2ML IJ SOLN
100.0000 ug | INTRAMUSCULAR | Status: DC | PRN
  Administered 2021-02-15 (×3): 100 ug via INTRAVENOUS
  Filled 2021-02-15 (×3): qty 2

## 2021-02-15 MED ORDER — OXYCODONE-ACETAMINOPHEN 5-325 MG PO TABS: 1.0000 | ORAL_TABLET | ORAL | Status: DC | PRN

## 2021-02-15 MED ORDER — ACETAMINOPHEN 500 MG PO TABS
1000.0000 mg | ORAL_TABLET | Freq: Once | ORAL | Status: AC
  Administered 2021-02-15: 1000 mg via ORAL
  Filled 2021-02-15: qty 2

## 2021-02-15 MED ORDER — PHENYLEPHRINE 40 MCG/ML (10ML) SYRINGE FOR IV PUSH (FOR BLOOD PRESSURE SUPPORT): 80.0000 ug | PREFILLED_SYRINGE | INTRAVENOUS | Status: DC | PRN

## 2021-02-15 MED ORDER — LACTATED RINGERS IV SOLN
500.0000 mL | INTRAVENOUS | Status: DC | PRN
  Administered 2021-02-15: 500 mL via INTRAVENOUS
  Administered 2021-02-15: 400 mL via INTRAVENOUS
  Administered 2021-02-16: 500 mL via INTRAVENOUS

## 2021-02-15 MED ORDER — SOD CITRATE-CITRIC ACID 500-334 MG/5ML PO SOLN
30.0000 mL | ORAL | Status: DC | PRN
  Filled 2021-02-15: qty 30

## 2021-02-15 MED ORDER — OXYTOCIN BOLUS FROM INFUSION: 333.0000 mL | Freq: Once | INTRAVENOUS | Status: DC

## 2021-02-15 NOTE — H&P (Signed)
OBSTETRIC ADMISSION HISTORY AND PHYSICAL  Armilda Allcorn is a 25 y.o. female G39P0030 with IUP at [redacted]w[redacted]d by early [place] presenting for early labor. Reports contractions that started this morning. She denies leaking fluid, vaginal bleeding, headache, vision changes, or RUQ pain. Endorses active fetal movement. She plans on breast feeding. She request Nexplanon for birth control. She received her prenatal care at  [facility]    Dating: By 7 week [place] --->  Estimated Date of Delivery: 02/11/21  Sono:    '@[redacted]w[redacted]d'$ , CWD, normal anatomy, cephalic presentation, anterior low-lying, 292g, 71% EFW  $Re'@[redacted]w[redacted]d'LYI$ , CWD, normal anatomy, cephalic presentation, anterior placenta, 2827g, 87% EFW   Prenatal History/Complications:  - Opioid use - Postdates - Rubella non-immune - GBS neg  Past Medical History: Past Medical History:  Diagnosis Date   ADHD (attention deficit hyperactivity disorder)    Contraceptive management 03/13/2013   IBS (irritable bowel syndrome)    Irregular bleeding 03/10/2013   Lactose intolerance    Low-lying placenta 09/23/2020   1.4 cm from os on initial anatomy scan Resolved 10/18/20    Past Surgical History: Past Surgical History:  Procedure Laterality Date   NO PAST SURGERIES      Obstetrical History: OB History     Gravida  4   Para  0   Term  0   Preterm  0   AB  3   Living  0      SAB  3   IAB  0   Ectopic  0   Multiple  0   Live Births             Social History Social History   Socioeconomic History   Marital status: Single    Spouse name: Not on file   Number of children: Not on file   Years of education: Not on file   Highest education level: Not on file  Occupational History   Not on file  Tobacco Use   Smoking status: Every Day    Packs/day: 0.50    Years: 6.00    Pack years: 3.00    Types: Cigarettes   Smokeless tobacco: Never  Vaping Use   Vaping Use: Every day   Substances: Nicotine-salt  Substance and Sexual Activity   Alcohol  use: Not Currently    Comment: occ   Drug use: Not Currently    Types: Marijuana, Heroin, Methamphetamines    Comment: last used 8.5 months ago   Sexual activity: Yes    Birth control/protection: None, Condom  Other Topics Concern   Not on file  Social History Narrative   Right handed    Social Determinants of Health   Financial Resource Strain: Not on file  Food Insecurity: No Food Insecurity   Worried About Charity fundraiser in the Last Year: Never true   Ran Out of Food in the Last Year: Never true  Transportation Needs: Unmet Transportation Needs   Lack of Transportation (Medical): Yes   Lack of Transportation (Non-Medical): Yes  Physical Activity: Not on file  Stress: Not on file  Social Connections: Not on file   Family History: Family History  Problem Relation Age of Onset   Hypertension Maternal Grandmother    Diabetes Maternal Grandmother    Allergies: Allergies  Allergen Reactions   Lactose Intolerance (Gi)     Medications Prior to Admission  Medication Sig Dispense Refill Last Dose   cyclobenzaprine (FLEXERIL) 10 MG tablet Take 1 tablet (10 mg total) by mouth every  8 (eight) hours as needed for muscle spasms. 30 tablet 3 02/15/2021   pantoprazole (PROTONIX) 20 MG tablet Take 1 tablet (20 mg total) by mouth daily. 30 tablet 5 02/15/2021   Prenatal Vit-Fe Fumarate-FA (M-NATAL PLUS) 27-1 MG TABS Take 1 tablet by mouth daily. 30 tablet 11 02/15/2021   promethazine (PHENERGAN) 25 MG tablet Take 1 tablet (25 mg total) by mouth every 6 (six) hours as needed for nausea or vomiting. 30 tablet 2 02/15/2021   SUBOXONE 2-0.5 MG FILM Take 2 mg (one film) in the morning and 1 mg (half of one film) in the evening 42 each 0 02/15/2021   Blood Pressure Monitoring (BLOOD PRESSURE KIT) DEVI 1 Device by Does not apply route as needed. 1 each 0    mupirocin 2% oint-hydrocortisone 2.5% cream-nystatin cream-zinc oxide 13% oint 1:1:1:5 mixture Apply topically 2 (two) times daily.  120 g 0    Review of Systems   All systems reviewed and negative except as stated in HPI  Blood pressure 121/64, pulse (!) 118, temperature 98.8 F (37.1 C), temperature source Oral, resp. rate 18, height 5' (1.524 m), weight 104 kg, last menstrual period 04/16/2020, SpO2 100 %. General appearance: alert and mild distress Lungs: effort normal Heart: tachycardic Abdomen: soft, non-tender Extremities: Homans sign is negative, no sign of DVT Presentation: cephalic Fetal monitoring: 160 bpm, minimal variability, +10x10, 1 variable decel Uterine activity: Q2-4 mins Dilation: 4 Effacement (%): 90 Station: -2 Exam by:: Truitt Leep, RNC  Prenatal labs: ABO, Rh: --/--/O POS (11/19 1815) Antibody: NEG (11/19 1815) Rubella: <0.90 (05/03 1600) RPR: Non Reactive (08/19 0846)  HBsAg: Negative (05/03 1600)  HIV: Non Reactive (08/19 0846)  GBS: Negative/-- (10/17 1146)  2 hr Glucola: 82/132/99 Genetic screening: LR NIPS, AFP neg Anatomy US: normal  Prenatal Transfer Tool  Maternal Diabetes: No Genetic Screening: Normal Maternal Ultrasounds/Referrals: Normal Fetal Ultrasounds or other Referrals:  None Maternal Substance Abuse:  Yes:  Type: Other: suboxone Significant Maternal Medications:  Meds include: Other: suboxone Significant Maternal Lab Results: Group B Strep negative  Results for orders placed or performed during the hospital encounter of 02/15/21 (from the past 24 hour(s))  Type and screen [place]   Collection Time: 02/15/21  6:15 PM  Result Value Ref Range   ABO/RH(D) O POS    Antibody Screen NEG    Sample Expiration      02/18/2021,2359 Performed at [facility], [place] [address], [place], [place] [address]   CBC   Collection Time: 02/15/21  6:24 PM  Result Value Ref Range   WBC 24.2 (H) 4.0 - 10.5 K/uL   RBC 3.73 (L) 3.87 - 5.11 MIL/uL   Hemoglobin 11.7 (L) 12.0 - 15.0 g/dL   HCT 35.0 (L) 36.0 - 46.0 %   MCV 93.8 80.0 - 100.0 fL   MCH 31.4  26.0 - 34.0 pg   MCHC 33.4 30.0 - 36.0 g/dL   RDW 13.9 11.5 - 15.5 %   Platelets 242 150 - 400 K/uL   nRBC 0.0 0.0 - 0.2 %    Patient Active Problem List   Diagnosis Date Noted   Indication for care in labor or delivery 02/15/2021   Syncope 11/26/2020   Rubella non-immune status, antepartum 08/13/2020   Supervision of high risk pregnancy, antepartum 07/30/2020   Opioid use disorder 07/30/2020    Assessment/Plan:  Lynise Porr is a 25 y.o. G4P0030 at 72w4dhere for early labor  #Labor: Expectant management. Augmentation with  Pitocin/AROM as indicated #Opioid use: Currently on Suboxone 32m AM and 153mPM. Has not had PM dose. Will order. UDS pending. Per patient, no other substance use #Pain: prn #FWB: Cat 2 #ID: GBS neg #MOF: Breast #MOC: Nexplanon (OP) #Circ:  N/A   DaRenee HarderCNM  02/15/2021, 7:21 PM

## 2021-02-15 NOTE — MAU Note (Signed)
Been contracting since left yesterday, never let up.  Closer and stronger. No bleeding, ? Leaking- was ruled out yesterday.

## 2021-02-15 NOTE — Progress Notes (Signed)
Labor Progress Note Dawn Huff is a 25 y.o. G4P0030 at [redacted]w[redacted]d presented for early labor.   S: Informed per RN that patient just had SROM with clear fluid and feeling uncomfortable. Went to bedside. Patient felt warm, temp 101.74F with FHR baseline now about 160's.   O:  BP (!) 115/56   Pulse (!) 124   Temp (!) 101.5 F (38.6 C) (Oral)   Resp 20   Ht 5' (1.524 m)   Wt 104 kg   LMP 04/16/2020 (Exact Date)   SpO2 100%   BMI 44.76 kg/m  EFM: 165/mod/15x15/none   CVE: Dilation: 5 Effacement (%): 90 Station: -2 Presentation: Vertex Exam by:: Dr. Annia Friendly   A&P: 25 y.o. [id number] [redacted]w[redacted]d  #Labor: Progressing well with recent SROM. Will continue expectant management.  #Pain: Just received IV fent, however IV infiltrated at that time and unclear how much was received as patient had no pain relief or effects. Trialing Nitro gas right now, may re-dose fent at 45 minute mark. She is wanting to hold off on epidural for now.   #FWB: Cat II with fetal tachycardia, but otherwise reassuring  #GBS negative  #Suspected Triple I: Maternal fever + fetal tachycardia. Amp and gent ordered. Given 1000mg  tylenol.   , DO 10:08 PM

## 2021-02-16 ENCOUNTER — Encounter (HOSPITAL_COMMUNITY)
Admission: AD | Disposition: A | Discharge status: Home / Self Care | Payer: Self-pay | Source: Home / Self Care | Attending: Family Medicine

## 2021-02-16 ENCOUNTER — Encounter (HOSPITAL_COMMUNITY): Payer: Self-pay | Admitting: Family Medicine

## 2021-02-16 ENCOUNTER — Inpatient Hospital Stay (HOSPITAL_COMMUNITY): Payer: Medicaid Other | Admitting: Anesthesiology

## 2021-02-16 DIAGNOSIS — O480 Post-term pregnancy: Secondary | ICD-10-CM

## 2021-02-16 DIAGNOSIS — O411290 Chorioamnionitis, unspecified trimester, not applicable or unspecified: Secondary | ICD-10-CM

## 2021-02-16 DIAGNOSIS — O99324 Drug use complicating childbirth: Secondary | ICD-10-CM

## 2021-02-16 DIAGNOSIS — O411230 Chorioamnionitis, third trimester, not applicable or unspecified: Secondary | ICD-10-CM

## 2021-02-16 DIAGNOSIS — Z3A40 40 weeks gestation of pregnancy: Secondary | ICD-10-CM

## 2021-02-16 LAB — RPR: RPR Ser Ql: NONREACTIVE

## 2021-02-16 SURGERY — Surgical Case
Anesthesia: Epidural | Site: Abdomen | Wound class: Clean Contaminated

## 2021-02-16 MED ORDER — SODIUM CHLORIDE 0.9 % IR SOLN
Status: DC | PRN
  Administered 2021-02-16: 1000 mL

## 2021-02-16 MED ORDER — OXYTOCIN-SODIUM CHLORIDE 30-0.9 UT/500ML-% IV SOLN: 2.5000 [IU]/h | INTRAVENOUS | Status: AC

## 2021-02-16 MED ORDER — DIBUCAINE (PERIANAL) 1 % EX OINT: 1.0000 "application " | TOPICAL_OINTMENT | CUTANEOUS | Status: DC | PRN

## 2021-02-16 MED ORDER — MORPHINE SULFATE (PF) 0.5 MG/ML IJ SOLN
INTRAMUSCULAR | Status: DC | PRN
  Administered 2021-02-16: 1 mg via INTRAVENOUS

## 2021-02-16 MED ORDER — MEDROXYPROGESTERONE ACETATE 150 MG/ML IM SUSP: 150.0000 mg | INTRAMUSCULAR | Status: DC | PRN

## 2021-02-16 MED ORDER — NALOXONE HCL 0.4 MG/ML IJ SOLN: 0.4000 mg | INTRAMUSCULAR | Status: DC | PRN

## 2021-02-16 MED ORDER — MEASLES, MUMPS & RUBELLA VAC IJ SOLR
0.5000 mL | Freq: Once | INTRAMUSCULAR | Status: AC
  Administered 2021-02-19: 0.5 mL via SUBCUTANEOUS
  Filled 2021-02-16: qty 0.5

## 2021-02-16 MED ORDER — MENTHOL 3 MG MT LOZG: 1.0000 | LOZENGE | OROMUCOSAL | Status: DC | PRN

## 2021-02-16 MED ORDER — TETANUS-DIPHTH-ACELL PERTUSSIS 5-2.5-18.5 LF-MCG/0.5 IM SUSY: 0.5000 mL | PREFILLED_SYRINGE | Freq: Once | INTRAMUSCULAR | Status: DC

## 2021-02-16 MED ORDER — DIPHENHYDRAMINE HCL 25 MG PO CAPS: 25.0000 mg | ORAL_CAPSULE | Freq: Four times a day (QID) | ORAL | Status: DC | PRN

## 2021-02-16 MED ORDER — GABAPENTIN 100 MG PO CAPS
100.0000 mg | ORAL_CAPSULE | Freq: Every day | ORAL | Status: DC
  Administered 2021-02-16 – 2021-02-18 (×3): 100 mg via ORAL
  Filled 2021-02-16 (×3): qty 1

## 2021-02-16 MED ORDER — LIDOCAINE-EPINEPHRINE (PF) 2 %-1:200000 IJ SOLN
INTRAMUSCULAR | Status: AC
  Filled 2021-02-16: qty 20

## 2021-02-16 MED ORDER — LACTATED RINGERS IV SOLN: INTRAVENOUS | Status: DC

## 2021-02-16 MED ORDER — FENTANYL CITRATE (PF) 100 MCG/2ML IJ SOLN
INTRAMUSCULAR | Status: DC | PRN
  Administered 2021-02-16: 100 ug via EPIDURAL

## 2021-02-16 MED ORDER — SOD CITRATE-CITRIC ACID 500-334 MG/5ML PO SOLN
30.0000 mL | ORAL | Status: AC
  Administered 2021-02-16: 30 mL via ORAL

## 2021-02-16 MED ORDER — SOD CITRATE-CITRIC ACID 500-334 MG/5ML PO SOLN: 30.0000 mL | ORAL | Status: DC

## 2021-02-16 MED ORDER — OXYTOCIN-SODIUM CHLORIDE 30-0.9 UT/500ML-% IV SOLN
1.0000 m[IU]/min | INTRAVENOUS | Status: DC
  Administered 2021-02-16: 2 m[IU]/min via INTRAVENOUS

## 2021-02-16 MED ORDER — WITCH HAZEL-GLYCERIN EX PADS: 1.0000 "application " | MEDICATED_PAD | CUTANEOUS | Status: DC | PRN

## 2021-02-16 MED ORDER — KETOROLAC TROMETHAMINE 30 MG/ML IJ SOLN
30.0000 mg | Freq: Four times a day (QID) | INTRAMUSCULAR | Status: AC
  Administered 2021-02-16 – 2021-02-17 (×4): 30 mg via INTRAVENOUS
  Filled 2021-02-16 (×4): qty 1

## 2021-02-16 MED ORDER — LACTATED RINGERS IV SOLN: INTRAVENOUS | Status: DC | PRN

## 2021-02-16 MED ORDER — DEXAMETHASONE SODIUM PHOSPHATE 10 MG/ML IJ SOLN
INTRAMUSCULAR | Status: AC
  Filled 2021-02-16: qty 1

## 2021-02-16 MED ORDER — KETOROLAC TROMETHAMINE 30 MG/ML IJ SOLN
30.0000 mg | Freq: Four times a day (QID) | INTRAMUSCULAR | Status: AC | PRN
  Administered 2021-02-16: 30 mg via INTRAVENOUS

## 2021-02-16 MED ORDER — BUPRENORPHINE HCL-NALOXONE HCL 2-0.5 MG SL SUBL
1.0000 | SUBLINGUAL_TABLET | Freq: Every day | SUBLINGUAL | Status: DC
  Administered 2021-02-16 – 2021-02-19 (×4): 1 via SUBLINGUAL
  Filled 2021-02-16 (×4): qty 1

## 2021-02-16 MED ORDER — MORPHINE SULFATE (PF) 0.5 MG/ML IJ SOLN
INTRAMUSCULAR | Status: DC | PRN
  Administered 2021-02-16: 3 mg via EPIDURAL

## 2021-02-16 MED ORDER — SCOPOLAMINE 1 MG/3DAYS TD PT72: 1.0000 | MEDICATED_PATCH | Freq: Once | TRANSDERMAL | Status: DC

## 2021-02-16 MED ORDER — PHENYLEPHRINE 40 MCG/ML (10ML) SYRINGE FOR IV PUSH (FOR BLOOD PRESSURE SUPPORT)
PREFILLED_SYRINGE | INTRAVENOUS | Status: AC
  Filled 2021-02-16: qty 20

## 2021-02-16 MED ORDER — PRENATAL MULTIVITAMIN CH
1.0000 | ORAL_TABLET | Freq: Every day | ORAL | Status: DC
  Administered 2021-02-17 – 2021-02-19 (×3): 1 via ORAL
  Filled 2021-02-16 (×3): qty 1

## 2021-02-16 MED ORDER — DIPHENHYDRAMINE HCL 25 MG PO CAPS: 25.0000 mg | ORAL_CAPSULE | ORAL | Status: DC | PRN

## 2021-02-16 MED ORDER — CEFAZOLIN SODIUM-DEXTROSE 2-4 GM/100ML-% IV SOLN
INTRAVENOUS | Status: AC
  Filled 2021-02-16: qty 100

## 2021-02-16 MED ORDER — OXYTOCIN-SODIUM CHLORIDE 30-0.9 UT/500ML-% IV SOLN
INTRAVENOUS | Status: AC
  Filled 2021-02-16: qty 500

## 2021-02-16 MED ORDER — NALBUPHINE HCL 10 MG/ML IJ SOLN: 5.0000 mg | Freq: Once | INTRAMUSCULAR | Status: DC | PRN

## 2021-02-16 MED ORDER — MORPHINE SULFATE (PF) 0.5 MG/ML IJ SOLN
INTRAMUSCULAR | Status: AC
  Filled 2021-02-16: qty 10

## 2021-02-16 MED ORDER — SODIUM CHLORIDE 0.9 % IV SOLN: 500.0000 mg | INTRAVENOUS | Status: DC

## 2021-02-16 MED ORDER — MAGNESIUM HYDROXIDE 400 MG/5ML PO SUSP: 30.0000 mL | ORAL | Status: DC | PRN

## 2021-02-16 MED ORDER — ACETAMINOPHEN 500 MG PO TABS: 1000.0000 mg | ORAL_TABLET | Freq: Four times a day (QID) | ORAL | Status: DC

## 2021-02-16 MED ORDER — ACETAMINOPHEN 10 MG/ML IV SOLN
INTRAVENOUS | Status: AC
  Filled 2021-02-16: qty 100

## 2021-02-16 MED ORDER — KETOROLAC TROMETHAMINE 30 MG/ML IJ SOLN
INTRAMUSCULAR | Status: AC
  Filled 2021-02-16: qty 1

## 2021-02-16 MED ORDER — BUPRENORPHINE HCL-NALOXONE HCL 2-0.5 MG SL SUBL
1.0000 | SUBLINGUAL_TABLET | Freq: Every day | SUBLINGUAL | Status: DC
  Filled 2021-02-16: qty 1

## 2021-02-16 MED ORDER — SIMETHICONE 80 MG PO CHEW
80.0000 mg | CHEWABLE_TABLET | Freq: Three times a day (TID) | ORAL | Status: DC
  Administered 2021-02-17 – 2021-02-19 (×7): 80 mg via ORAL
  Filled 2021-02-16 (×6): qty 1

## 2021-02-16 MED ORDER — SODIUM CHLORIDE 0.9 % IV SOLN
2.0000 g | Freq: Two times a day (BID) | INTRAVENOUS | Status: AC
  Administered 2021-02-16 (×2): 2 g via INTRAVENOUS
  Filled 2021-02-16 (×2): qty 2

## 2021-02-16 MED ORDER — BUPIVACAINE HCL 0.25 % IJ SOLN
INTRAMUSCULAR | Status: DC | PRN
  Administered 2021-02-16: 30 mL

## 2021-02-16 MED ORDER — FENTANYL CITRATE (PF) 100 MCG/2ML IJ SOLN
INTRAMUSCULAR | Status: AC
  Filled 2021-02-16: qty 2

## 2021-02-16 MED ORDER — ONDANSETRON HCL 4 MG/2ML IJ SOLN: 4.0000 mg | Freq: Three times a day (TID) | INTRAMUSCULAR | Status: DC | PRN

## 2021-02-16 MED ORDER — COCONUT OIL OIL
1.0000 "application " | TOPICAL_OIL | Status: DC | PRN
  Administered 2021-02-17: 1 via TOPICAL

## 2021-02-16 MED ORDER — OXYTOCIN-SODIUM CHLORIDE 30-0.9 UT/500ML-% IV SOLN
INTRAVENOUS | Status: DC | PRN
  Administered 2021-02-16: 300 mL via INTRAVENOUS

## 2021-02-16 MED ORDER — SENNOSIDES-DOCUSATE SODIUM 8.6-50 MG PO TABS
2.0000 | ORAL_TABLET | Freq: Every day | ORAL | Status: DC
  Administered 2021-02-17 – 2021-02-19 (×3): 2 via ORAL
  Filled 2021-02-16 (×3): qty 2

## 2021-02-16 MED ORDER — KETOROLAC TROMETHAMINE 30 MG/ML IJ SOLN: 30.0000 mg | Freq: Four times a day (QID) | INTRAMUSCULAR | Status: AC | PRN

## 2021-02-16 MED ORDER — ONDANSETRON HCL 4 MG/2ML IJ SOLN
INTRAMUSCULAR | Status: DC | PRN
  Administered 2021-02-16: 4 mg via INTRAVENOUS

## 2021-02-16 MED ORDER — DIPHENHYDRAMINE HCL 50 MG/ML IJ SOLN: 12.5000 mg | INTRAMUSCULAR | Status: DC | PRN

## 2021-02-16 MED ORDER — BUPIVACAINE HCL (PF) 0.25 % IJ SOLN
INTRAMUSCULAR | Status: AC
  Filled 2021-02-16: qty 30

## 2021-02-16 MED ORDER — DEXMEDETOMIDINE (PRECEDEX) IN NS 20 MCG/5ML (4 MCG/ML) IV SYRINGE
PREFILLED_SYRINGE | INTRAVENOUS | Status: AC
  Filled 2021-02-16: qty 5

## 2021-02-16 MED ORDER — TERBUTALINE SULFATE 1 MG/ML IJ SOLN: 0.2500 mg | Freq: Once | INTRAMUSCULAR | Status: DC | PRN

## 2021-02-16 MED ORDER — ONDANSETRON HCL 4 MG/2ML IJ SOLN
INTRAMUSCULAR | Status: AC
  Filled 2021-02-16: qty 2

## 2021-02-16 MED ORDER — ENOXAPARIN SODIUM 60 MG/0.6ML IJ SOSY
60.0000 mg | PREFILLED_SYRINGE | INTRAMUSCULAR | Status: DC
  Administered 2021-02-16 – 2021-02-18 (×3): 60 mg via SUBCUTANEOUS
  Filled 2021-02-16 (×3): qty 0.6

## 2021-02-16 MED ORDER — IBUPROFEN 600 MG PO TABS
600.0000 mg | ORAL_TABLET | Freq: Four times a day (QID) | ORAL | Status: DC
  Administered 2021-02-17 – 2021-02-19 (×6): 600 mg via ORAL
  Filled 2021-02-16 (×6): qty 1

## 2021-02-16 MED ORDER — SODIUM CHLORIDE 0.9 % IV SOLN
500.0000 mg | INTRAVENOUS | Status: AC
  Administered 2021-02-16: 500 mg via INTRAVENOUS

## 2021-02-16 MED ORDER — FENTANYL CITRATE (PF) 100 MCG/2ML IJ SOLN
25.0000 ug | INTRAMUSCULAR | Status: DC | PRN
  Administered 2021-02-16: 50 ug via INTRAVENOUS

## 2021-02-16 MED ORDER — NALOXONE HCL 4 MG/10ML IJ SOLN
1.0000 ug/kg/h | INTRAVENOUS | Status: DC | PRN
  Filled 2021-02-16: qty 5

## 2021-02-16 MED ORDER — BUPRENORPHINE HCL-NALOXONE HCL 2-0.5 MG SL SUBL
0.5000 | SUBLINGUAL_TABLET | Freq: Every day | SUBLINGUAL | Status: DC
  Administered 2021-02-16 – 2021-02-18 (×3): 0.5 via SUBLINGUAL
  Filled 2021-02-16 (×3): qty 1

## 2021-02-16 MED ORDER — LIDOCAINE-EPINEPHRINE (PF) 2 %-1:200000 IJ SOLN
INTRAMUSCULAR | Status: DC | PRN
  Administered 2021-02-16 (×4): 5 mL via EPIDURAL

## 2021-02-16 MED ORDER — NALBUPHINE HCL 10 MG/ML IJ SOLN: 5.0000 mg | INTRAMUSCULAR | Status: DC | PRN

## 2021-02-16 MED ORDER — ACETAMINOPHEN 10 MG/ML IV SOLN
1000.0000 mg | Freq: Once | INTRAVENOUS | Status: DC | PRN
  Administered 2021-02-16: 1000 mg via INTRAVENOUS

## 2021-02-16 MED ORDER — DEXMEDETOMIDINE (PRECEDEX) IN NS 20 MCG/5ML (4 MCG/ML) IV SYRINGE
PREFILLED_SYRINGE | INTRAVENOUS | Status: DC | PRN
  Administered 2021-02-16 (×5): 4 ug via INTRAVENOUS

## 2021-02-16 MED ORDER — OXYCODONE HCL 5 MG PO TABS
5.0000 mg | ORAL_TABLET | ORAL | Status: DC | PRN
  Administered 2021-02-16 – 2021-02-17 (×3): 5 mg via ORAL
  Administered 2021-02-17 (×2): 10 mg via ORAL
  Administered 2021-02-18 (×2): 5 mg via ORAL
  Administered 2021-02-18 (×2): 10 mg via ORAL
  Filled 2021-02-16: qty 1
  Filled 2021-02-16 (×2): qty 2
  Filled 2021-02-16 (×2): qty 1
  Filled 2021-02-16 (×3): qty 2
  Filled 2021-02-16: qty 1
  Filled 2021-02-16: qty 2

## 2021-02-16 MED ORDER — ACETAMINOPHEN 500 MG PO TABS
1000.0000 mg | ORAL_TABLET | Freq: Four times a day (QID) | ORAL | Status: DC
  Administered 2021-02-16 – 2021-02-19 (×11): 1000 mg via ORAL
  Filled 2021-02-16 (×12): qty 2

## 2021-02-16 MED ORDER — SODIUM CHLORIDE 0.9% FLUSH: 3.0000 mL | INTRAVENOUS | Status: DC | PRN

## 2021-02-16 MED ORDER — CEFAZOLIN SODIUM-DEXTROSE 2-4 GM/100ML-% IV SOLN
2.0000 g | INTRAVENOUS | Status: AC
  Administered 2021-02-16: 2 g via INTRAVENOUS

## 2021-02-16 MED ORDER — DEXAMETHASONE SODIUM PHOSPHATE 10 MG/ML IJ SOLN
INTRAMUSCULAR | Status: DC | PRN
  Administered 2021-02-16: 10 mg via INTRAVENOUS

## 2021-02-16 MED ORDER — SIMETHICONE 80 MG PO CHEW
80.0000 mg | CHEWABLE_TABLET | ORAL | Status: DC | PRN
  Filled 2021-02-16: qty 1

## 2021-02-16 MED ORDER — PHENYLEPHRINE HCL (PRESSORS) 10 MG/ML IV SOLN
INTRAVENOUS | Status: DC | PRN
  Administered 2021-02-16 (×2): 80 ug via INTRAVENOUS
  Administered 2021-02-16 (×2): 40 ug via INTRAVENOUS
  Administered 2021-02-16: 80 ug via INTRAVENOUS
  Administered 2021-02-16: 40 ug via INTRAVENOUS

## 2021-02-16 SURGICAL SUPPLY — 31 items
BENZOIN TINCTURE PRP APPL 2/3 (GAUZE/BANDAGES/DRESSINGS) ×2 IMPLANT
CANISTER WOUND CARE 500ML ATS (WOUND CARE) ×2 IMPLANT
CLAMP CORD UMBIL (MISCELLANEOUS) ×2 IMPLANT
CLOTH BEACON ORANGE TIMEOUT ST (SAFETY) ×2 IMPLANT
DRSG OPSITE POSTOP 4X10 (GAUZE/BANDAGES/DRESSINGS) ×2 IMPLANT
ELECT REM PT RETURN 9FT ADLT (ELECTROSURGICAL) ×2
ELECTRODE REM PT RTRN 9FT ADLT (ELECTROSURGICAL) ×1 IMPLANT
EXTRACTOR VACUUM M CUP 4 TUBE (SUCTIONS) IMPLANT
GLOVE BIOGEL PI IND STRL 7.0 (GLOVE) ×3 IMPLANT
GLOVE BIOGEL PI INDICATOR 7.0 (GLOVE) ×3
GLOVE ECLIPSE 7.0 STRL STRAW (GLOVE) ×2 IMPLANT
GOWN STRL REUS W/TWL LRG LVL3 (GOWN DISPOSABLE) ×4 IMPLANT
KIT ABG SYR 3ML LUER SLIP (SYRINGE) ×2 IMPLANT
NEEDLE HYPO 22GX1.5 SAFETY (NEEDLE) ×2 IMPLANT
NEEDLE HYPO 25X5/8 SAFETYGLIDE (NEEDLE) ×2 IMPLANT
NS IRRIG 1000ML POUR BTL (IV SOLUTION) ×2 IMPLANT
PACK C SECTION WH (CUSTOM PROCEDURE TRAY) ×2 IMPLANT
PAD ABD 7.5X8 STRL (GAUZE/BANDAGES/DRESSINGS) ×2 IMPLANT
PAD OB MATERNITY 4.3X12.25 (PERSONAL CARE ITEMS) ×2 IMPLANT
PENCIL SMOKE EVAC W/HOLSTER (ELECTROSURGICAL) ×2 IMPLANT
RTRCTR C-SECT PINK 25CM LRG (MISCELLANEOUS) ×2 IMPLANT
STRIP CLOSURE SKIN 1/2X4 (GAUZE/BANDAGES/DRESSINGS) ×2 IMPLANT
SUT MNCRL 0 VIOLET CTX 36 (SUTURE) ×2 IMPLANT
SUT MONOCRYL 0 CTX 36 (SUTURE) ×2
SUT VIC AB 0 CTX 36 (SUTURE) ×1
SUT VIC AB 0 CTX36XBRD ANBCTRL (SUTURE) ×1 IMPLANT
SUT VIC AB 4-0 KS 27 (SUTURE) ×2 IMPLANT
SYR 30ML LL (SYRINGE) ×2 IMPLANT
TOWEL OR 17X24 6PK STRL BLUE (TOWEL DISPOSABLE) ×2 IMPLANT
TRAY FOLEY W/BAG SLVR 14FR LF (SET/KITS/TRAYS/PACK) ×2 IMPLANT
WATER STERILE IRR 1000ML POUR (IV SOLUTION) ×2 IMPLANT

## 2021-02-16 NOTE — Progress Notes (Addendum)
Patient ID: Dawn Huff, female   DOB: 1995-09-24, 25 y.o.   MRN: [id number]  Patient arrived with fetal tachycardia and presumed Triple I. Treated appropriately with Abx and Tylenol. She was initially 4 cm. Has now had epidural and pitocin started. FHR has had less variability and some new decels. She is now 5 cm and unchanged x many hours. IUPC and FSE placed without significant scalp stim. She is not in adequate labor. Given remoteness from delivery and loss of variability and and new decels, will move to cesarean delivery. Risks of cesarean section reviewed.  Risks include but are not limited to bleeding, infection, injury to surrounding structures, hysterectomy, blood clots, and death.

## 2021-02-16 NOTE — Op Note (Signed)
Preoperative Diagnosis:  IUP @ [redacted]w[redacted]d, Triple I, Non-reassuring FHT   Postoperative Diagnosis:  Same  Procedure: Primary low transverse cesarean section  Surgeon: Tinnie Gens, M.D.  Assistant: Leticia Penna, DO   Anesthesia: Epidural with Tanna Furry, MD   Findings: Viable female infant APGAR (1 MIN): 8   APGAR (5 MINS): 8   Normal tubes and ovaries  Estimated blood loss: 1199 cc  Complications: None known  Specimens: Placenta to pathology   Reason for procedure: Dawn Huff is a 25 y.o. P3I9518 at [redacted]w[redacted]d presenting for a cesarean section secondary to non-reassuring fetal status. She had presumed triple I with maternal fever and fetal tachycardia, FHT continued to progress towards absent variability without positive scalp stimulation remote from delivery. The risks of cesarean section discussed with the patient included but were not limited to: bleeding which may require transfusion or reoperation; infection which may require antibiotics; injury to bowel, bladder, ureters or other surrounding organs; injury to the fetus; need for additional procedures including hysterectomy in the event of a life-threatening hemorrhage; placental abnormalities wth subsequent pregnancies, incisional problems, thromboembolic phenomenon and other postoperative/anesthesia complications. The patient concurred with the proposed plan, giving informed written consent for the procedure.    Procedure: Patient to the OR where epidural analgesia was found to be adequate. She was then placed in a supine position with left lateral tilt. She received 2 g of Ancef/azithromycin and SCDs were in place. A Foley catheter was already in place in the bladder. She was prepped and draped in the usual sterile fashion. A timeout was performed. A knife was then used to make a Pfannenstiel incision. This incision was carried out to underlying fascia which was divided in the midline with the knife. The incision was extended laterally,  bluntly. The fascia was dissected of the underlying rectus superiorly.  The rectus was divided in the midline.  The peritoneal cavity was entered bluntly.  Alexis retractor was placed inside the incision.  A knife was used to make a low transverse incision on the uterus. This incision was carried down to the amniotic cavity was entered. Fetus was in L OT position and was brought up out of the incision without difficulty. Cord was clamped x 2 and cut. Infant taken to waiting pediatrician.  Cord blood was obtained. Placenta was delivered from the uterus.  Uterus was cleaned with dry lap pads. Uterine incision closed with 0 Monocryl suture in a locked running fashion. An imbricating layer was made with 0 Monocryl in a locked running fashion.    Alexis retractor was removed from the abdomen. Peritoneal closure was done with 0 Vicryl suture. Fascia is closed with 0 Vicryl suture in a running fashion. Subcutaneous tissue infused with 30cc 0.25% Marcaine.  Subcutaneous closure was performed with 0 Plain suture.  Skin closed using 4-0 Vicryl on a Keith needle.  A prevena suction was placed.  All instrument, needle and lap counts were correct x 2.  Patient was awake and taken to PACU stable.  Infant to Newborn Nursery, stable.    [name], DO

## 2021-02-16 NOTE — Progress Notes (Signed)
Labor Progress Note Dawn Huff is a 25 y.o. G4P0030 at [redacted]w[redacted]d presented for early labor.   Checked about 1.5 hours ago, still about 5 cm, however just got her epidural at 12:30 and much more comfortable. FHT between minimal and moderate variability, Dr. Shawnie Pons additionally reviewed the strip. Previously contractions were closer together, however have been spacing. Discussed with RN to quickly recheck and if no progress to start pit 2x2.   Called back by RN shortly after that patient and family (significant other and patient's mother) at bedside were declining recheck and pit at this time. [name] is worried that epidurals and pit have killed mothers (educated that this should not be the case currently) and that she would like the patient to rest with the hopes her cervix will dilate without intervention.   Stressed that her cervix has been largely unchanged for the past several hours despite SROM with clinically evident Triple I/persistent fetal tachycardia. Discussed the necessity for progression of her labor as definitive treatment. However, additionally hopeful that she is making cervical change now that her body has been able to relax.   Pt/family are willing to allow recheck and pit if unchanged in the next 30 minutes, will plan to return at 0215. FHT currently Cat II with tachycardia, but moderate variability. Will plan for NICU to be present at delivery.   O:  BP (!) 101/48   Pulse (!) 130   Temp 100.2 F (37.9 C) (Axillary)   Resp 15   Ht 5' (1.524 m)   Wt 104 kg   LMP 04/16/2020 (Exact Date)   SpO2 96%   BMI 44.76 kg/m   CVE: Dilation: 5 Effacement (%): 100 Station: -1, 0 Presentation: Vertex Exam by:: Valarie Merino RN  [name], DO 1:49 AM

## 2021-02-16 NOTE — Progress Notes (Signed)
Dr Charlotta Newton stated it was ok to give pt oxycodone

## 2021-02-16 NOTE — Discharge Summary (Addendum)
Postpartum Discharge Summary  Date of Service updated     Patient Name: Dawn Huff DOB: 06/29/1995 MRN: [id number]  Date of admission: 02/15/2021 Delivery date:02/16/2021  Delivering provider: Donnamae Jude  Date of discharge: 02/19/2021  Admitting diagnosis: Indication for care in labor or delivery [O75.9] Intrauterine pregnancy: [redacted]w[redacted]d    Secondary diagnosis:  Principal Problem:   Indication for care in labor or delivery Active Problems:   Opioid use disorder   Rubella non-immune status, antepartum   Syncope   Chorioamnionitis  Additional problems: Suboxone use, Postpartum elevated blood pressures, LE edema     Discharge diagnosis: Term Pregnancy Delivered and PPH                                              Post partum procedures: None  Augmentation: Pitocin Complications: Intrauterine Inflammation or infection (Chorioamniotis) and Hemorrhage>10074m Hospital course: Onset of Labor With Unplanned C/S   [age].o. yo [id number] 4039w5ds admitted in Latent Labor on 02/15/2021. Patient had a labor course significant for maternal fever and persistent fetal tachycardia. Despite augmentation with pit, her cervix remained unchanged with progression of FHT to worsening minimal/absent variability (in the setting of presumed Triple I) still remote from delivery (5cm). Thus, proceeded with primary C/S. The patient went for cesarean section due to Non-Reassuring FHR. Delivery details as follows: Membrane Rupture Time/Date: 9:10 PM ,02/15/2021   Delivery Method:C-Section, Low Transverse  Details of operation can be found in separate operative note. Patient had an uncomplicated postpartum course. She continued on Abx x24 hours after C/S due to triple I. She had 2+ pitting LE edema bilaterally with some elevated blood pressure postpartum, she was started on procardia and lasix BID for 5 days. Kdur was sent with the lasix prescription given her hypokalemia earlier in admission. She is  ambulating,tolerating a regular diet, passing flatus, and urinating well.  Patient is discharged home in stable condition 02/20/21.  Newborn Data: Birth date:02/16/2021  Birth time:6:13 AM  Gender:Female  Living status:Living  Apgars:8 ,8  Weight:3904 g   Magnesium Sulfate received: No BMZ received: No Rhophylac:No MMR:No T-DaP:Given prenatally Flu: No Transfusion:No  Physical exam  Vitals:   02/18/21 0500 02/18/21 1500 02/18/21 2100 02/19/21 0700  BP: 121/73 (!) 128/95 123/68 129/83  Pulse: (!) 103     Resp: 18 18 18 16   Temp: 98.1 F (36.7 C) 98.1 F (36.7 C) 98.5 F (36.9 C) 98.4 F (36.9 C)  TempSrc: Oral Oral Oral Oral  SpO2: 100%  100% 97%  Weight:      Height:       General: alert, cooperative, and no distress Lochia: appropriate Uterine Fundus: firm Incision: Prevena in place  DVT Evaluation: 2+ pitting edema to BLE, equal in size bilaterally  Labs: Lab Results  Component Value Date   WBC 21.9 (H) 02/17/2021   HGB 9.2 (L) 02/17/2021   HCT 26.3 (L) 02/17/2021   MCV 93.6 02/17/2021   PLT 250 02/17/2021   CMP Latest Ref Rng & Units 02/13/2021  Glucose 70 - 99 mg/dL 99  BUN 6 - 20 mg/dL <5(L)  Creatinine 0.44 - 1.00 mg/dL 0.60  Sodium 135 - 145 mmol/L 138  Potassium 3.5 - 5.1 mmol/L 3.4(L)  Chloride 98 - 111 mmol/L 108  CO2 22 - 32 mmol/L 22  Calcium 8.9 - 10.3 mg/dL 8.6(L)  Total Protein 6.5 - 8.1 g/dL 5.1(L)  Total Bilirubin 0.3 - 1.2 mg/dL 0.3  Alkaline Phos 38 - 126 U/L 157(H)  AST 15 - 41 U/L 15  ALT 0 - 44 U/L 15   Edinburgh Score: Edinburgh Postnatal Depression Scale Screening Tool 02/18/2021  I have been able to laugh and see the funny side of things. 0  I have looked forward with enjoyment to things. 0  I have blamed myself unnecessarily when things went wrong. 1  I have been anxious or worried for no good reason. 2  I have felt scared or panicky for no good reason. 0  Things have been getting on top of me. 0  I have been so unhappy  that I have had difficulty sleeping. 0  I have felt sad or miserable. 1  I have been so unhappy that I have been crying. 0  The thought of harming myself has occurred to me. 0  Edinburgh Postnatal Depression Scale Total 4     After visit meds:  Allergies as of 02/19/2021       Reactions   Lactose Intolerance (gi)         Medication List     STOP taking these medications    mupirocin 2% oint-hydrocortisone 2.5% cream-nystatin cream-zinc oxide 13% oint 1:1:1:5 mixture   pantoprazole 20 MG tablet Commonly known as: Protonix   promethazine 25 MG tablet Commonly known as: PHENERGAN       TAKE these medications    acetaminophen 500 MG tablet Commonly known as: TYLENOL Take 2 tablets (1,000 mg total) by mouth every 6 (six) hours as needed.   Blood Pressure Kit Devi 1 Device by Does not apply route as needed.   cyclobenzaprine 10 MG tablet Commonly known as: FLEXERIL Take 1 tablet (10 mg total) by mouth every 8 (eight) hours as needed for muscle spasms.   FeroSul 325 (65 FE) MG tablet Generic drug: ferrous sulfate Take 1 tablet (325 mg total) by mouth every other day. Start taking on: February 21, 2021   furosemide 20 MG tablet Commonly known as: LASIX Take 1 tablet (20 mg total) by mouth 2 (two) times daily for 4 days.   ibuprofen 600 MG tablet Commonly known as: ADVIL Take 1 tablet (600 mg total) by mouth every 6 (six) hours as needed.   M-Natal Plus 27-1 MG Tabs Take 1 tablet by mouth daily.   NIFEdipine 30 MG 24 hr tablet Commonly known as: ADALAT CC Take 1 tablet (30 mg total) by mouth daily.   oxyCODONE 5 MG immediate release tablet Commonly known as: Oxy IR/ROXICODONE Take 1-2 tablets (5-10 mg total) by mouth every 6 (six) hours as needed for severe pain.   potassium chloride SA 20 MEQ tablet Commonly known as: KLOR-CON Take 1 tablet (20 mEq total) by mouth 2 (two) times daily for 4 days.   Suboxone 2-0.5 MG Film Generic drug: Buprenorphine  HCl-Naloxone HCl Take 2 mg (one film) in the morning and 1 mg (half of one film) in the evening         Discharge home in stable condition Infant Feeding: Breast Infant Disposition:rooming in Discharge instruction: per After Visit Summary and Postpartum booklet. Activity: Advance as tolerated. Pelvic rest for 6 weeks.  Diet: routine diet Future Appointments: Future Appointments  Date Time Provider [place]  02/25/2021  9:20 AM [facility] NURSE [facility]  03/10/2021  3:15 PM [facility] CLINIC[facility]  04/04/2021 10:35 AM Aletha Halim, MD [facility]  [facility]   Follow up Visit:  [place] [facility] [place] [facility] Follow up.   Specialty: Obstetrics and Gynecology Contact information: [place] [id number] [phone]                Message sent to [facility] by Dr Higinio Plan:   Please schedule this patient for a In person postpartum visit in 6 weeks with the following provider: Any provider. Additional Postpartum F/U:Postpartum Depression checkup and Incision check 1 week  High risk pregnancy complicated by:  Opioid use disorder on suboxone  Delivery mode:  C-Section, Low Transverse  Anticipated Birth Control:  Nexplanon outpatient    02/20/2021 Patriciaann Clan, DO

## 2021-02-16 NOTE — Anesthesia Procedure Notes (Signed)
Epidural Patient location during procedure: OB Start time: 02/16/2021 12:20 AM End time: 02/16/2021 12:30 AM  Staffing Anesthesiologist: Elmer Picker, MD Performed: anesthesiologist   Preanesthetic Checklist Completed: patient identified, IV checked, risks and benefits discussed, monitors and equipment checked, pre-op evaluation and timeout performed  Epidural Patient position: sitting Prep: DuraPrep and site prepped and draped Patient monitoring: continuous pulse ox, blood pressure, heart rate and cardiac monitor Approach: midline Location: L3-L4 Injection technique: LOR air  Needle:  Needle type: Tuohy  Needle gauge: 17 G Needle length: 9 cm Needle insertion depth: 8 cm Catheter type: closed end flexible Catheter size: 19 Gauge Catheter at skin depth: 14 cm Test dose: negative  Assessment Sensory level: T8 Events: blood not aspirated, injection not painful, no injection resistance, no paresthesia and negative IV test  Additional Notes Patient identified. Risks/Benefits/Options discussed with patient including but not limited to bleeding, infection, nerve damage, paralysis, failed block, incomplete pain control, headache, blood pressure changes, nausea, vomiting, reactions to medication both or allergic, itching and postpartum back pain. Confirmed with bedside nurse the patient's most recent platelet count. Confirmed with patient that they are not currently taking any anticoagulation, have any bleeding history or any family history of bleeding disorders. Patient expressed understanding and wished to proceed. All questions were answered. Sterile technique was used throughout the entire procedure. Please see nursing notes for vital signs. Test dose was given through epidural catheter and negative prior to continuing to dose epidural or start infusion. Warning signs of high block given to the patient including shortness of breath, tingling/numbness in hands, complete motor  block, or any concerning symptoms with instructions to call for help. Patient was given instructions on fall risk and not to get out of bed. All questions and concerns addressed with instructions to call with any issues or inadequate analgesia.  Reason for block:procedure for pain

## 2021-02-16 NOTE — Transfer of Care (Signed)
Immediate Anesthesia Transfer of Care Note  Patient: Dawn Huff  Procedure(s) Performed: CESAREAN SECTION (Abdomen)  Patient Location: PACU  Anesthesia Type:Epidural  Level of Consciousness: awake  Airway & Oxygen Therapy: Patient Spontanous Breathing  Post-op Assessment: Report given to RN  Post vital signs: Reviewed and stable  Last Vitals:  Vitals Value Taken Time  BP 95/46 02/16/21 0728  Temp    Pulse 115 02/16/21 0730  Resp 26 02/16/21 0730  SpO2 97 % 02/16/21 0730  Vitals shown include unvalidated device data.  Last Pain:  Vitals:   02/16/21 0727  TempSrc:   PainSc: 0-No pain      Patients Stated Pain Goal: 0 (02/15/21 2035)  Complications: No notable events documented.

## 2021-02-16 NOTE — Lactation Note (Signed)
This note was copied from a baby's chart. Lactation Consultation Note  Patient Name: Dawn Huff [id number] Date: 02/16/2021 Reason for consult: Initial assessment;1st time breastfeeding (C/S deliver, ESC infant see mom's medical information.) Age:25 hours LC entered the room, infant was having an assessment done. Per mom, she really wants to breastfeed her daughter, infant has been sleepy, this will be her 1st attempt to latch infant at the breast. LC observe mom has flat nipples, mom was given hand pump to pre-pump breast prior to latching infant. Mom pre-pump her breast  prior to latching infant , mom attempted to latch infant on her right breast using the football hold position, infant only held nipple in mouth and did not elicit the SSB response. Afterwards mom learned hand expression, LC used doll module, LC was hands off and mom expressed 4 mls of colostrum that was spoon feed to infant. LC observed infant has maxillary labial frenulum that appears tight, mom may need help infant by flanging her top lip outward once she starts latching at the breast. Mom shown how to use DEBP & how to disassemble, clean, & reassemble parts.  Mom was using the DEBP as LC left the room. Mom's plan: 1- Mom will breastfeed infant according to feeding cues, 8 to 12+ or more times within 24 hours, skin to skin. 2- Mom will pre-pump breast prior to latching infant. 3- Mom knows to hand express or give infant back any EBM from using DEBP if infant is not latching at the breast. 4- Mom will call RN/LC if she needs further assistance with latching infant at the breast.    Maternal Data Has patient been taught Hand Expression?: Yes Does the patient have breastfeeding experience prior to this delivery?: No  Feeding Mother's Current Feeding Choice: Breast Milk  LATCH Score Latch: Too sleepy or reluctant, no latch achieved, no sucking elicited.  Audible Swallowing: None  Type of Nipple: Flat (Mom used  hand pump to pre-pump breast prior to latching infant.)  Comfort (Breast/Nipple): Soft / non-tender  Hold (Positioning): Assistance needed to correctly position infant at breast and maintain latch.  LATCH Score: 4   Lactation Tools Discussed/Used Tools: Pump Breast pump type: Double-Electric Breast Pump Pump Education: Setup, frequency, and cleaning;Milk Storage Reason for Pumping: Mom will pump every 3 hours for 15 minutes on inital setting, infant is sleepy and not elicting SSB reponse holds nipple in her mouth. Pumping frequency: Mom will pump every 3 hours for 15 minutes on inital setting.  Interventions Interventions: Breast feeding basics reviewed;Assisted with latch;Skin to skin;Pre-pump if needed;Hand express;Breast compression;Adjust position;Support pillows;Position options;Expressed milk;DEBP;Education;LC Services brochure  Discharge Pump: DEBP;Personal (Per mom, she has DEBP at home.)  Consult Status Consult Status: Follow-up Date: 02/17/21 Follow-up type: In-patient    Danelle Earthly 02/16/2021, 5:29 PM

## 2021-02-16 NOTE — Anesthesia Postprocedure Evaluation (Signed)
Anesthesia Post Note  Patient: Dawn Huff  Procedure(s) Performed: CESAREAN SECTION (Abdomen)     Patient location during evaluation: Mother Baby Anesthesia Type: Epidural Level of consciousness: oriented and awake and alert Pain management: pain level controlled Vital Signs Assessment: post-procedure vital signs reviewed and stable Respiratory status: spontaneous breathing and respiratory function stable Cardiovascular status: blood pressure returned to baseline and stable Postop Assessment: no headache, no backache, no apparent nausea or vomiting and able to ambulate Anesthetic complications: no   No notable events documented.  Last Vitals:  Vitals:   02/16/21 0802 02/16/21 0855  BP: 107/63 (!) 97/51  Pulse: (!) 106 (!) 115  Resp: 16 18  Temp:  37.1 C  SpO2: 96% 100%    Last Pain:  Vitals:   02/16/21 0855  TempSrc: Axillary  PainSc:    Pain Goal: Patients Stated Pain Goal: 0 (02/15/21 2035)                 Mellody Dance

## 2021-02-16 NOTE — Anesthesia Preprocedure Evaluation (Signed)
Anesthesia Evaluation  Patient identified by MRN, date of birth, ID band Patient awake    Reviewed: Allergy & Precautions, NPO status , Patient's Chart, lab work & pertinent test results  Airway Mallampati: III  TM Distance: >3 FB Neck ROM: Full    Dental no notable dental hx.    Pulmonary Current SmokerPatient did not abstain from smoking.,    Pulmonary exam normal breath sounds clear to auscultation       Cardiovascular negative cardio ROS Normal cardiovascular exam Rhythm:Regular Rate:Normal     Neuro/Psych negative neurological ROS  negative psych ROS   GI/Hepatic negative GI ROS, Neg liver ROS,   Endo/Other  Morbid obesity (BMI 45)  Renal/GU negative Renal ROS  negative genitourinary   Musculoskeletal   Abdominal   Peds  (+) ADHD Hematology negative hematology ROS (+)   Anesthesia Other Findings On suboxone  Reproductive/Obstetrics (+) Pregnancy                             Anesthesia Physical Anesthesia Plan  ASA: 3  Anesthesia Plan: Epidural   Post-op Pain Management:    Induction:   PONV Risk Score and Plan: Treatment may vary due to age or medical condition  Airway Management Planned: Natural Airway  Additional Equipment:   Intra-op Plan:   Post-operative Plan:   Informed Consent: I have reviewed the patients History and Physical, chart, labs and discussed the procedure including the risks, benefits and alternatives for the proposed anesthesia with the patient or authorized representative who has indicated his/her understanding and acceptance.       Plan Discussed with: Anesthesiologist  Anesthesia Plan Comments: (Patient identified. Risks, benefits, options discussed with patient including but not limited to bleeding, infection, nerve damage, paralysis, failed block, incomplete pain control, headache, blood pressure changes, nausea, vomiting, reactions to  medication, itching, and post partum back pain. Confirmed with bedside nurse the patient's most recent platelet count. Confirmed with the patient that they are not taking any anticoagulation, have any bleeding history or any family history of bleeding disorders. Patient expressed understanding and wishes to proceed. All questions were answered. )        Anesthesia Quick Evaluation

## 2021-02-16 NOTE — Progress Notes (Signed)
Labor Progress Note Dawn Huff is a 25 y.o. G4P0030 at [redacted]w[redacted]d presented for early labor.   S: Returned for cervical check. Fortunately she was able to rest.  O:  BP 118/62   Pulse (!) 116   Temp 98.6 F (37 C) (Axillary)   Resp 18   Ht 5' (1.524 m)   Wt 104 kg   LMP 04/16/2020 (Exact Date)   SpO2 98%   BMI 44.76 kg/m  EFM: 160/min to mod var/none/none  CVE: Dilation: 5 Effacement (%): 100 Station: -2 Presentation: Vertex Exam by:: Dr. Annia Friendly   A&P: 25 y.o. [id number] [redacted]w[redacted]d  #Labor: Cervix unchanged from previous. Contractions appear to be about every 7 minutes now on toco. Stressed importance of starting pit 2x2. Patient and family initially hesitant, however provided reassurance and de-bunked some concerns associated with the medication (instances of death etc). Patient agreed to proceed, start pit 2x2. Placed into left lateral side with a peanut. Fetal position does feel slightly asynclitic.  #Pain: Epidural in place.  #FWB: Cat II with interchanging minimal var, however fetal baseline improving. Monitoring closely.  #GBS negative  #Triple I: Maternal temp/HR and fetal HR baseline improving. Already received one dose of Amp/Gent. Working towards delivery for ultimate treatment. Delivery call will be made.   [name], DO 2:52 AM

## 2021-02-16 NOTE — Lactation Note (Signed)
This note was copied from a baby's chart. Lactation Consultation Note  Patient Name: Dawn Huff [id number] Date: 02/16/2021 Reason for consult: Initial assessment;Term;Primapara;1st time breastfeeding Age:25 hours   LC Initial Consult:  Attempted to visit with mother, however, she was almost asleep when I arrived; she will call for a return visit when she awakens.  Support person present and baby asleep in the bassinet.  RN updated.   Maternal Data    Feeding Mother's Current Feeding Choice: Breast Milk  LATCH Score                    Lactation Tools Discussed/Used    Interventions    Discharge    Consult Status Consult Status: Follow-up Date: 02/16/21 Follow-up type: In-patient    Dora Sims 02/16/2021, 12:39 PM

## 2021-02-17 LAB — CBC
HCT: 26.3 % — ABNORMAL LOW (ref 36.0–46.0)
Hemoglobin: 9.2 g/dL — ABNORMAL LOW (ref 12.0–15.0)
MCH: 32.7 pg (ref 26.0–34.0)
MCHC: 35 g/dL (ref 30.0–36.0)
MCV: 93.6 fL (ref 80.0–100.0)
Platelets: 250 10*3/uL (ref 150–400)
RBC: 2.81 MIL/uL — ABNORMAL LOW (ref 3.87–5.11)
RDW: 14.4 % (ref 11.5–15.5)
WBC: 21.9 10*3/uL — ABNORMAL HIGH (ref 4.0–10.5)
nRBC: 0.1 % (ref 0.0–0.2)

## 2021-02-17 MED ORDER — HYDROXYZINE HCL 25 MG PO TABS
25.0000 mg | ORAL_TABLET | Freq: Four times a day (QID) | ORAL | Status: DC | PRN
  Administered 2021-02-17 – 2021-02-18 (×2): 25 mg via ORAL
  Filled 2021-02-17 (×2): qty 1

## 2021-02-17 MED ORDER — FERROUS SULFATE 325 (65 FE) MG PO TABS
325.0000 mg | ORAL_TABLET | ORAL | Status: DC
  Administered 2021-02-17 – 2021-02-19 (×2): 325 mg via ORAL
  Filled 2021-02-17 (×2): qty 1

## 2021-02-17 NOTE — Lactation Note (Signed)
This note was copied from a baby's chart. Lactation Consultation Note  Patient Name: Girl Sundeep Cary IWPYK'D Date: 02/17/2021 Reason for consult: Follow-up assessment;Term;Primapara;1st time breastfeeding Age:25 hours   P1 mother whose infant is now 76 hours old.  This is a term baby at 40+5 weeks.  Mother is on Suboxone and baby "Lajoyce Corners" will be observed for 5 days before considering a discharge.  Mother had baby latched when I arrived, although she was not latched deeply.  Mother stated she had just been feeding for 5 minutes.  Offered to assist and mother receptive. Asked her to remove clothing and feed STS.  Reviewed breast feeding basics and how to obtain a deep latch.  Mother wanted to independently latch; allowed her to do so and then provided guidance on how to latch deeply and proper body alignment and positioning.  Reminded mother to compress breast and keep fingers away from nipple when doing hand expression.  Baby continued to have a shallow latch and mother allowed me to assist.  When I was able to assist and get a deeper latch mother commented on how that felt much "different."  Mother denied pain.  I am uncertain as to how well she has been latching since birth.  Discussed how to keep her actively feeding at the breast; "Lajoyce Corners" gets sleepy.  Mother observant as to how I assisted in awakening her.  Explained the importance of keeping her actively engaged with her feedings.  Partner listening to discussion.  Encouraged mother to call her RN/LC for assistance with latching as needed.  Mother has a DEBP for home use.  She has been having some social issues with the baby's father and his mother.  She informed me that this has caused some stress.  Support given and praised mother for making baby "Lajoyce Corners" a priority right now and meeting her needs.  RN updated.   Maternal Data Has patient been taught Hand Expression?: Yes Does the patient have breastfeeding experience prior to this  delivery?: No  Feeding Mother's Current Feeding Choice: Breast Milk  LATCH Score Latch: Repeated attempts needed to sustain latch, nipple held in mouth throughout feeding, stimulation needed to elicit sucking reflex.  Audible Swallowing: A few with stimulation  Type of Nipple: Everted at rest and after stimulation (Short shafted)  Comfort (Breast/Nipple): Soft / non-tender  Hold (Positioning): Assistance needed to correctly position infant at breast and maintain latch.  LATCH Score: 7   Lactation Tools Discussed/Used Tools: Pump;Flanges;Coconut oil Flange Size: 24;27 Breast pump type: Double-Electric Breast Pump;Manual Pump Education:  (Mother has not been pumping consistently but stated she is going to pump soon) Reason for Pumping: Set up by previous LC Pumping frequency: PRN  Interventions Interventions: Breast feeding basics reviewed;Assisted with latch;Skin to skin;Breast massage;Hand express;Breast compression;Hand pump;Coconut oil;Position options;Support pillows;Adjust position;DEBP;Education  Discharge Pump: DEBP;Manual;Personal WIC Program: No  Consult Status Consult Status: Follow-up Date: 02/18/21 Follow-up type: In-patient    Frankey Botting R Yoltzin Barg 02/17/2021, 5:11 PM

## 2021-02-17 NOTE — Clinical Social Work Maternal (Signed)
CLINICAL SOCIAL WORK MATERNAL/CHILD NOTE  Patient Details  Name: Dawn Huff MRN: [id number] Date of Birth: 13-Aug-1995  Date:  02/17/2021  Clinical Social Worker Initiating Note:  Kathrin Greathouse, [place] Date/Time: Initiated:  02/17/21/1406     Child's Name:  Berdie Ogren  Biological Parents:  Mother (MOB: Avabella Wailes [birth year] 1995)   Need for Interpreter:  None   Reason for Referral:  Current Substance Use/Substance Use During Pregnancy  , Behavioral Health Concerns   Address:  [address] [place]  [address]    Phone number:  [phone] (home)     Additional phone number:   Household Members/Support Persons (HM/SP):   Household Member/Support Person 1   HM/SP Name Relationship DOB or Age  HM/SP -1 Cherlyn Cushing Mother 09-05-1966  HM/SP -2        HM/SP -3        HM/SP -4        HM/SP -5        HM/SP -6        HM/SP -7        HM/SP -8          Natural Supports (not living in the home):  Immediate Family, Extended Family   Professional Supports: Therapist ([employer] quality scientist)   Employment: Unemployed   Type of Work:     Education:  Other (comment) (GED)   Homebound arranged:    Museum/gallery curator Resources:  Medicaid   Other Resources:  [place], Physicist, medical     Cultural/Religious Considerations Which May Impact Care:    Strengths:  Ability to meet basic needs  , Home prepared for child  , Pediatrician chosen, Compliance with medical plan     Psychotropic Medications:         Pediatrician:    [place]  Pediatrician List:   [place]      Pediatrician Fax Number:    Risk Factors/Current Problems:  Substance Use     Cognitive State:  Able to Concentrate  , Alert  , Insightful  , Linear Thinking     Mood/Affect:  Calm     CSW Assessment: CSW received consult for hx of Drug 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 CSW met with  MOB to offer support and complete assessment.    CSW met with MOB at bedside and introduced CSW role. CSW observed MOB up in room, maternal grandfather holding the infant and visitor present. CSW offered MOB privacy. MOB family and visitor left to allow privacy. MOB presented calm and welcomed CSW visist. MOB confirmed the demographic information on hospital file is correct. MOB reported she lives with her mother Cherlyn Cushing. MOB identified her mother, father, stepmother, sister, grandparents and friends as supports. MOB reported she does not want to shared FOB information because she is not sure if he will be involved with the infant. MOB expressed feeling stressed about FOB current intentions to be involved now since FOB was not involved during the pregnancy. MOB shared her focus and attention is caring for the baby. MOB denied domestic violence concerns. CSW provided active listening and offered emotional support. MOB shared she was given Vistaril to help with her anxiety symptoms.   CSW inquired about MOB treatment and history of substance use. MOB disclosed she started suboxone treatment about 8.5 months ago when she found out she was pregnant. MOB shared "She (infant)  changed my life." MOB reported she stopped using substances (heroin, methamphetamines, and marijuana) with several negative drug screens to confirm she has not used. MOB reported she receives treatment through [facility] with Dr. Dione Plover and will continue suboxone treatment. MOB shared she also receives SAIOP services through [facility] in [place], and she sees counselor "Ena Dawley" for weekly therapy sessions. MOB shared in August 2022, she was diagnosed with PTSD, anxiety, major depression so she feels the weekly therapy has helped. MOB reported she will continue therapy postpartum. MOB openly shared that she is currently on probation for charges related to her hx of substance use. MOB shared she has been on probation almost a year and is  hoping to be off probation in January 2023. MOB reported her probation does not impede her ability to care for the infant. MOB shared interest in attending school among other things when she is off probation. CSW encouraged MOB ambitions.   CSW explained the hospital Drug Policy. MOB made aware that CSW will monitor the infants UDS, CDS and make a report to CPS if warranted. MOB made aware that CSW will notify CPS of in utero substance exposure. MOB reported understanding and had no questions. CSW discussed PPD symptoms and provided education regarding the baby blues period vs. perinatal mood disorders. CSW recommended MOB complete a self-evaluation during the postpartum time period using the New Mom Checklist from Postpartum Progress and encouraged MOB to contact a medical professional if symptoms are noted at any time. MOB reported she feels comfortable reaching out to her doctor if concerns. CSW assessed MOB for safety. MOB denied thoughts of harm to self and others.   CSW inquired if MOB has items for the infant. MOB reported she has essential items for the infant including a car seat and a bassinet where the infant will sleep. CSW provided review of Sudden Infant Death Syndrome (SIDS) precautions. MOB reported understanding. MOB has chosen Pensions consultant for infant's follow up care and will have transportation to the appointments. MOB reported she has an appointment with [facility] tomorrow via phone and that she is currently receives FS through her mother's benefits. CSW discussed community support programs and referral with MOB. MOB gave CSW permission to make referral to [facility] for [facility] and [facility].   CSW will Continue to Monitor UDS, CDS and Make Report if Warranted,  CSW will made a community referral to [facility] and [facility] for Children.   CSW identifies no further need for intervention and no barriers to discharge at this time.      CSW Plan/Description:   Sudden Infant Death Syndrome (SIDS) Education, CSW Will Continue to Monitor Umbilical Cord Tissue Drug Screen Results and Make Report if Warranted, Other Information/Referral to [employer], Psychosocial Support and Ongoing Assessment of Needs, Hospital Drug Screen Policy Information, Perinatal Mood and Anxiety Disorder (PMADs) Education, No Further Intervention Required/No Barriers to Discharge    Lia Hopping, LCSW 02/17/2021, 3:54 PM

## 2021-02-17 NOTE — Progress Notes (Addendum)
POSTPARTUM PROGRESS NOTE  Subjective: Dawn Huff is a 25 y.o. [phone] s/p C-section, due to triple I, non-reassuring FHT  at [redacted]w[redacted]d.  She is very sleepy on exam and doesn't want to stay awake to talk, but denies any major complaints when prompted. No acute events overnight. She denies any problems with ambulating, voiding or po intake. Denies nausea or vomiting. She has passed flatus. She has not had bowel movement. Pain is well controlled.  Lochia is minimal. No dizziness, dyspnea, and chest palpitation  Objective: BP 107/63 (BP Location: Left Arm)   Pulse (!) 102   Temp 98.4 F (36.9 C) (Oral)   Resp 20   Ht 5' (1.524 m)   Wt 104 kg   LMP 04/16/2020 (Exact Date)   SpO2 98%   Breastfeeding Unknown   BMI 44.76 kg/m   Physical Exam:  General: alert, cooperative and no distress Chest: CTAB, no respiratory distress Abdomen: soft, non-tender  Uterine Fundus: firm, appropriately tender, Prevana in place Extremities: 3+ bilateral pitting edema, no tenderness or warmth  Recent Labs    02/15/21 1824  HGB 11.7*  HCT 35.0*    Assessment/Plan: Dawn Huff is a 25 y.o. [id number] s/p pLTCS at [redacted]w[redacted]d for triple I, non-reassuring FHT.   LOS: 2 days   PPD#1: Doing well, pain well-controlled- on Suboxone, but receiving all oxycodone 5mg  14h PRN. -- Routine postpartum care, lactation support -- Encouraged up OOB -- Contraception: Nexplanon- prior to discharge -- Feeding: breast feeding and bottle feeding  Hx substance use disorder: On suboxone. SW to see patient prior to discharge.  Anemia: Hgb 9.2 this morning, from 11.7 on 11/19.  --- Start PO iron, every other day  Dispo: Plan for discharge tomorrow.  12/19, D.O. 02/17/2021 7:36am

## 2021-02-17 NOTE — Plan of Care (Signed)
  Problem: Health Behavior/Discharge Planning: Goal: Ability to manage health-related needs will improve Note: Patient having some social anxiety due to father of the baby and has been extremely frustrated. Patient also complaining about large prevena pump that is connected to her incision stating that "this has got to go;" and is causing more anxiety during her movement in room.  Called Dr. Alysia Penna requesting the possibility of the prevena pump being replaced with portable pump that is placed at discharge. Dr. Gerarda Fraction ordered that portable prevena pump may be placed. Dr. Alysia Penna also ordered vistaril prn for anxiety. Earl Gala, Linda Hedges [place]

## 2021-02-18 ENCOUNTER — Ambulatory Visit: Payer: Medicaid Other

## 2021-02-18 LAB — SURGICAL PATHOLOGY

## 2021-02-18 MED ORDER — FUROSEMIDE 20 MG PO TABS
20.0000 mg | ORAL_TABLET | Freq: Two times a day (BID) | ORAL | Status: DC
  Administered 2021-02-18 – 2021-02-19 (×3): 20 mg via ORAL
  Filled 2021-02-18 (×6): qty 1

## 2021-02-18 NOTE — Progress Notes (Signed)
Post Operative Day 2 Subjective: Doing well. No acute events overnight. Pain is controlled and bleeding is appropriate. The majority of her pain is in her lower abdomen near her incision. Her wound vac is in place. She also has some left sided back pain. She is eating, drinking, voiding, and ambulating without issue. She has no other concerns at this time.  Objective: Blood pressure 121/73, pulse (!) 103, temperature 98.1 F (36.7 C), temperature source Oral, resp. rate 18, height 5' (1.524 m), weight 104 kg, last menstrual period 04/16/2020, SpO2 100 %, unknown if currently breastfeeding.  Physical Exam:  General: alert, cooperative, and no distress Lochia: appropriate Uterine Fundus: firm and below umbilicus  MSK: muscle spasm noted in left flank, mildly tender to palpation Incision: no significant drainage, no significant erythema, Prevena in place DVT Evaluation: no calf tenderness to palpation 2+ pitting edema to knees bilaterally  Recent Labs    02/15/21 1824 02/17/21 0517  HGB 11.7* 9.2*  HCT 35.0* 26.3*    Assessment/Plan: Dawn Huff is a 25 y.o. [id number] on POD# 2 s/p pLTCS.  Progressing well. Meeting postpartum milestones. VSS. Continue routine postpartum care.  Hx of substance use disorder: - Stable, continue home Suboxone   Post-op pain: - Continue current pain regimen - Will plan to apply heat to left back for additional relief of muscular pain/spasm   Acute blood loss anemia:  - Remains asymptomatic  - Continue PO iron   Triple I: - S/p Amp/Gent and Cefotetan  - Remains afebrile  - Will continue to monitor while inpatient   LE swelling: - Significant pedal and lower leg edema (2+ to knees bilaterally)  - Will start PO Lasix 20 mg BID for 5 days   Feeding: Breast and bottle  Contraception: Nexplanon outpatient   Dispo: Plan for discharge on POD#3.   LOS: 3 days   Worthy Rancher, MD  02/18/2021, 11:25 AM

## 2021-02-18 NOTE — Lactation Note (Addendum)
This note was copied from a baby's chart. Lactation Consultation Note  Patient Name: Dawn Huff Dawn Huff Date: 02/18/2021 Reason for consult: Follow-up assessment;Mother's request;Term;Breastfeeding assistance Age:25 hours  On arrival, infant resting comfortably following feeding 2 hrs prior. Mom complaining of sore nipples Right more than Left with bruising and compression stripes noted. Mom latching in cradle. LC reviewed different feeding positions cross cradle and football, with infant more prone position to get more depth. Infant adequate urine and stool output to account for 6 % weight loss.  Mom stated since 10 am infant had 1 urine and 2 stools, she was unsure of the time.  LC alerted RN Marc Morgans, Mom nipples are sore, breast shells were provided. Mom need assistance with next latch to get more depth. If she cannot tolerate a latch, may need a breast rest and offer more volume per feeding.   Plan 1. To feed based on cues 8-12x 24hr period. Mom to breastfeed trying different positions to get more depth including laying supine with infant prone.  2. Mom to supplement with EBM first followed by DBM 18-25 ml per feeding. If not able to latch, Mom can supplement 30 ml or more.  3. Mom encouraged to pump with DEBP q 3 hrs for .  All questions answered at the end of the visit.  Mom indicated in room pain not well controlled. LC reviewed medication with pharmacist Herbert Seta, stated additional narcotics require infant monitored for sedation. LC alerted RN, Marc Morgans. Maternal Data    Feeding Mother's Current Feeding Choice: Breast Milk and Donor Milk  LATCH Score                    Lactation Tools Discussed/Used Tools: Pump;Flanges;Shells (Mom provided with breast shells nipples sore with bruising right more than left. Mom to use coconut oil before pumping and breast shells not pumping, sleeping or nursing. mom aware to wash and rinse them in between use) Breast  pump type: Double-Electric Breast Pump Reason for Pumping: increase stimulation Pumping frequency: every 3 hrs for 15 min  Interventions Interventions: Breast feeding basics reviewed;DEBP;Education;Position options;Breast massage;Hand express;Expressed milk;Pace feeding;Shells;Coconut oil;Infant Driven Feeding Algorithm education;Breast compression  Discharge    Consult Status Consult Status: Follow-up Date: 02/19/21 Follow-up type: In-patient    Dawn Huff  Dawn Huff 02/18/2021, 11:28 PM

## 2021-02-19 ENCOUNTER — Other Ambulatory Visit (HOSPITAL_COMMUNITY): Payer: Self-pay

## 2021-02-19 MED ORDER — NIFEDIPINE ER 30 MG PO TB24
30.0000 mg | ORAL_TABLET | Freq: Every day | ORAL | 0 refills | Status: DC
  Filled 2021-02-19: qty 30, 30d supply, fill #0

## 2021-02-19 MED ORDER — FUROSEMIDE 20 MG PO TABS
20.0000 mg | ORAL_TABLET | Freq: Two times a day (BID) | ORAL | 0 refills | Status: DC
  Filled 2021-02-19: qty 8, 4d supply, fill #0

## 2021-02-19 MED ORDER — FERROUS SULFATE 325 (65 FE) MG PO TABS
325.0000 mg | ORAL_TABLET | ORAL | 0 refills | Status: DC
  Filled 2021-02-19: qty 60, 120d supply, fill #0

## 2021-02-19 MED ORDER — POTASSIUM CHLORIDE CRYS ER 20 MEQ PO TBCR
20.0000 meq | EXTENDED_RELEASE_TABLET | Freq: Two times a day (BID) | ORAL | 0 refills | Status: DC
  Filled 2021-02-19: qty 8, 4d supply, fill #0

## 2021-02-19 MED ORDER — IBUPROFEN 600 MG PO TABS
600.0000 mg | ORAL_TABLET | Freq: Four times a day (QID) | ORAL | 0 refills | Status: DC | PRN
  Filled 2021-02-19: qty 60, 15d supply, fill #0

## 2021-02-19 MED ORDER — ACETAMINOPHEN 500 MG PO TABS: 1000.0000 mg | ORAL_TABLET | Freq: Four times a day (QID) | ORAL | 0 refills | Status: DC | PRN

## 2021-02-19 MED ORDER — OXYCODONE HCL 5 MG PO TABS
5.0000 mg | ORAL_TABLET | Freq: Four times a day (QID) | ORAL | 0 refills | Status: DC | PRN
  Filled 2021-02-19: qty 15, 2d supply, fill #0

## 2021-02-19 NOTE — Discharge Instructions (Signed)
PLEASE follow up for your incision check next week to get your suction dressing off.   Procardia and lasix are for your blood pressure and leg swelling. The lasix will be done in 4 days, you will continue the procardia. Please monitor your blood pressure at home with a cuff. Goal is to keep your BP <130/80. If you are feeling lightheaded/dizziness, SOB, chest pain, or have any concern--please call the office or present to the MAU.

## 2021-02-20 ENCOUNTER — Ambulatory Visit: Payer: Self-pay

## 2021-02-20 NOTE — Lactation Note (Signed)
This note was copied from a baby's chart. Lactation Consultation Note  Patient Name: Dawn Huff SWNIO'E Date: 02/20/2021 Reason for consult: Follow-up assessment;Mother's request;Difficult latch;Term;Breastfeeding assistance;Nipple pain/trauma;Infant weight loss Age:25 days  LC assisted with trying to latch infant with more depth. Lingual attachment anterior noted in midst of visit. Mom nipples sore with compression stripe and bruising. LC provided 20 NS to use with next feeding along with latching in prone position. LC demonstrated how to apply NS during visit.   Mom to use coconut oil for nipple care. Mom denied pain with use of current flange size.   Infant not latch at time of visit. Mom to offer more volume after supplementing. RN, Vivi Martens alerted lingual attachment may effect ability infant transfer breast effectively.   Mom continue to post pump with dEBP q 3hrs for on maintenance setting.  All questions answered at the end of the visit.     Maternal Data    Feeding Mother's Current Feeding Choice: Breast Milk and Formula  LATCH Score                    Lactation Tools Discussed/Used Tools: Pump;Flanges;Bottle;Nipple Shields;Coconut oil Nipple shield size: 20 Breast pump type: Double-Electric Breast Pump Reason for Pumping: increase stimulation Pumping frequency: every 3 hrs for (Mom denied pain with use of current flange size.)  Interventions Interventions: Breast feeding basics reviewed;Adjust position;DEBP;Assisted with latch;Support pillows;Skin to skin;Position options;Education;Pace feeding;Expressed milk;Breast massage;Hand express;LC Psychologist, educational;Infant Driven Feeding Algorithm education;Breast compression  Discharge    Consult Status Consult Status: Follow-up Date: 02/21/21 Follow-up type: In-patient    [name]  [name] 02/20/2021, 5:13 PM

## 2021-02-21 ENCOUNTER — Ambulatory Visit: Payer: Self-pay

## 2021-02-21 NOTE — Lactation Note (Signed)
This note was copied from a baby's chart. Lactation Consultation Note  Patient Name: Dawn Huff OFHQR'F Date: 02/21/2021 Reason for consult: Follow-up assessment;1st time breastfeeding;Primapara;Term;Difficult latch;Nipple pain/trauma Age:25 days  ESC infant, Maternal Suboxone   LC in to visit with P1 Mom of term baby. Baby at  6% weight loss which is basically the same as yesterday. Baby is breastfeeding and supplementing with donor milk and EBM by paced bottle.  Mom aware of need to increase volume per feeding.  Mom now expressing 40-50 ml at each pumping and trying to pump every 3 hrs.  Mom has a [employer] DEBP at home.  Advised about the [employer] DEBP for rent in gift shop at hospital.  Mom instructed to take all her pump parts home.  Mom has bilateral positional stripes on nipples.  Mom states baby was identified in having an anterior short lingual frenulum.  Baby's tongue does remain low in mouth with crying.  This LC unable to see an anterior lingual frenulum, but does see a more posterior one.  Mom states Ped was going to clip it today.    Mom feeding baby donor breast milk and EBM by bottle.  Mom offered a latch assist/assess before discharge.  If baby gets a frenectomy, Mom will call for [facility] assessment.  Mom interested in OP lactation F/U.  Message sent to clinic.   Engorgement prevention and treatment reviewed.  Mom aware of OP lactation support and encouraged to call prn.  Lactation Tools Discussed/Used Tools: Pump;Flanges;Bottle;Coconut oil Breast pump type: Double-Electric Breast Pump Pump Education: Setup, frequency, and cleaning Pumping frequency: Q 3hrs Pumped volume: 50 mL  Interventions Interventions: Breast feeding basics reviewed;Skin to skin;Breast massage;Hand express;Coconut oil;Hand pump;DEBP;Pace feeding;Education  Discharge Discharge Education: Engorgement and breast care;Warning signs for feeding baby;Outpatient recommendation;Outpatient Epic  message sent Pump: Personal;Refer for rental ([employer] DEBP)  Consult Status Consult Status: Complete Follow-up type: Out-patient    Dawn Huff 02/21/2021, 10:35 AM

## 2021-02-25 ENCOUNTER — Other Ambulatory Visit: Payer: Self-pay

## 2021-02-25 ENCOUNTER — Encounter: Payer: Self-pay | Admitting: Family Medicine

## 2021-02-25 ENCOUNTER — Ambulatory Visit (INDEPENDENT_AMBULATORY_CARE_PROVIDER_SITE_OTHER): Payer: Medicaid Other

## 2021-02-25 VITALS — BP 113/81 | HR 102 | Wt 205.4 lb

## 2021-02-25 DIAGNOSIS — Z5189 Encounter for other specified aftercare: Secondary | ICD-10-CM

## 2021-02-25 NOTE — Progress Notes (Signed)
Pt here today for BP check and removal of Provena wound vac.  Provena removed without complications.  Pt tolerated well.  Incision well approximated- no odor, no drainage, no edema, and no erythema.  Pt reports taking Nifedipine 30 mg daily.  Last dose was this am around 1000.  BP 113/81 LA.  Pt denies headache and visual disturbances.  Pt advised to allow warm, soapy water to run over incision and pat dry.  Pt encouraged to monitor for signs of elevated BP and that we will evaluate her at her pp visit.  Pt verbalized understanding.  Ralene Bathe, RN

## 2021-02-26 ENCOUNTER — Telehealth: Payer: Self-pay | Admitting: Family Medicine

## 2021-02-26 NOTE — BH Specialist Note (Signed)
Pt did not arrive to video visit and did not answer the phone; Left HIPPA-compliant message to call back Renelle Stegenga from [facility] at  [phone] (Dierks Wach's office).  ?; left MyChart message for patient.  ? ?

## 2021-02-26 NOTE — Progress Notes (Signed)
Chart reviewed for nurse visit. Agree with plan of care.   Venora Maples, MD 02/26/21 12:57 PM

## 2021-02-26 NOTE — Telephone Encounter (Signed)
Called patient as she had expressed to clinic team that she wants to wean off suboxone.  After discussion primary reason is she is concerned about passing medicine on to her baby while breastfeeding. We reviewed that only about 1% of maternal dose is passed to infant based on available studies. Given her low dose of 3mg  total daily this is a trivial amount. We also discussed that this is a poor time to come off of this medication, given the difficulties of being a new parent. I recommended we wait until baby is at least a year old and then reassess. She is amenable to this plan.  Admin staff please reschedule patient's postpartum visit with me.

## 2021-03-01 ENCOUNTER — Telehealth (HOSPITAL_COMMUNITY): Payer: Self-pay

## 2021-03-01 NOTE — Telephone Encounter (Signed)
"  I'm doing good. My healing is going pretty good. My dressing is off. What can I put on my incision?" RN told pateint to keep dressing clean and dry, reviewed incision care. RN also reviewed signs of infection and what to report to the provider.  "Baby is doing good. Growing well. Eating and gaining weight. Baby sleeps in a bassinet." RN reviewed ABC's of safe sleep with patient. Patient declines any questions or concerns about baby.  EPDS score was 1.  Marcelino Duster [facility] 03/01/2021,1145

## 2021-03-03 ENCOUNTER — Other Ambulatory Visit: Payer: Self-pay | Admitting: Family Medicine

## 2021-03-03 ENCOUNTER — Encounter: Payer: Self-pay | Admitting: Family Medicine

## 2021-03-03 DIAGNOSIS — O99323 Drug use complicating pregnancy, third trimester: Secondary | ICD-10-CM

## 2021-03-03 DIAGNOSIS — F1190 Opioid use, unspecified, uncomplicated: Secondary | ICD-10-CM

## 2021-03-03 MED ORDER — SUBOXONE 2-0.5 MG SL FILM: ORAL_FILM | SUBLINGUAL | 0 refills | Status: DC

## 2021-03-03 NOTE — Progress Notes (Signed)
Suboxone refill

## 2021-03-10 ENCOUNTER — Ambulatory Visit: Payer: Medicaid Other | Admitting: Clinical

## 2021-03-10 DIAGNOSIS — Z91199 Patient's noncompliance with other medical treatment and regimen due to unspecified reason: Secondary | ICD-10-CM

## 2021-03-26 NOTE — Progress Notes (Deleted)
° ° °  Post Partum Visit Note  Dawn Huff is a 25 y.o. [phone] female who presents for a postpartum visit. She is 5 weeks postpartum following a primary cesarean section.  I have fully reviewed the prenatal and intrapartum course. The delivery was at 40.5 gestational weeks.  Anesthesia: spinal. Postpartum course has been ***. Baby is doing well***. Baby is feeding by breast. Bleeding {vag bleed:12292}. Bowel function is {normal:32111}. Bladder function is {normal:32111}. Patient {is/is not:9024} sexually active. Contraception method is Nexplanon. Postpartum depression screening: {gen negative/positive:315881}.   The pregnancy intention screening data noted above was reviewed. Potential methods of contraception were discussed. The patient elected to proceed with No data recorded.    Health Maintenance Due  Topic Date Due   COVID-19 Vaccine (1) Never done   Pneumococcal Vaccine 67-62 Years old (1 - PCV) Never done   HPV VACCINES (1 - 2-dose series) Never done    {Common ambulatory SmartLinks:19316}  Review of Systems {ros; complete:30496}  Objective:  LMP 04/16/2020 (Exact Date)    General:  {gen appearance:16600}   Breasts:  {desc; normal/abnormal/not indicated:14647}  Lungs: {lung exam:16931}  Heart:  {heart exam:5510}  Abdomen: {abdomen exam:16834}   Wound {Wound assessment:11097}  GU exam:  {desc; normal/abnormal/not indicated:14647}       Assessment:    There are no diagnoses linked to this encounter.  *** postpartum exam.   Plan:   Essential components of care per ACOG recommendations:  1.  Mood and well being: Patient with {gen negative/positive:315881} depression screening today. Reviewed local resources for support.  - Patient tobacco use? {tobacco use:25506}  - hx of drug use? {yes/no:25505}    2. Infant care and feeding:  -Patient currently breastmilk feeding? {yes/no:25502}  -Social determinants of health (SDOH) reviewed in EPIC. No concerns***The following  needs were identified***  3. Sexuality, contraception and birth spacing - Patient {DOES_DOES IEP:32951} want a pregnancy in the next year.  Desired family size is {NUMBER 1-10:22536} children.  - Reviewed forms of contraception in tiered fashion. Patient desired {PLAN CONTRACEPTION:313102} today.   - Discussed birth spacing of 18 months  4. Sleep and fatigue -Encouraged family/partner/community support of 4 hrs of uninterrupted sleep to help with mood and fatigue  5. Physical Recovery  - Discussed patients delivery and complications. She describes her labor as {description:25511} - Patient had a {CHL AMB DELIVERY:989 401 8563}. Patient had a {laceration:25518} laceration. Perineal healing reviewed. Patient expressed understanding - Patient has urinary incontinence? {yes/no:25515} - Patient {ACTION; IS/IS OAC:16606301} safe to resume physical and sexual activity  6.  Health Maintenance - HM due items addressed {Yes or If no, why not?:20788} - Last pap smear  Diagnosis  Date Value Ref Range Status  07/30/2020   Final   - Negative for intraepithelial lesion or malignancy (NILM)   Pap smear {done:10129} at today's visit.  -Breast Cancer screening indicated? {indicated:25516}  7. Chronic Disease/Pregnancy Condition follow up: {Follow up:25499}  - PCP follow up  Isabell Jarvis, RN Center for [employer], [facility]

## 2021-03-28 ENCOUNTER — Ambulatory Visit: Payer: Medicaid Other | Admitting: Family Medicine

## 2021-03-31 ENCOUNTER — Other Ambulatory Visit: Payer: Self-pay | Admitting: Family Medicine

## 2021-03-31 DIAGNOSIS — F1190 Opioid use, unspecified, uncomplicated: Secondary | ICD-10-CM

## 2021-03-31 DIAGNOSIS — O99323 Drug use complicating pregnancy, third trimester: Secondary | ICD-10-CM

## 2021-03-31 DIAGNOSIS — F1120 Opioid dependence, uncomplicated: Secondary | ICD-10-CM

## 2021-04-01 MED ORDER — SUBOXONE 2-0.5 MG SL FILM: ORAL_FILM | SUBLINGUAL | 0 refills | Status: DC

## 2021-04-04 ENCOUNTER — Ambulatory Visit: Payer: Medicaid Other | Admitting: Obstetrics and Gynecology

## 2021-04-11 ENCOUNTER — Ambulatory Visit: Payer: Medicaid Other | Admitting: Family Medicine

## 2021-04-18 ENCOUNTER — Ambulatory Visit (INDEPENDENT_AMBULATORY_CARE_PROVIDER_SITE_OTHER): Payer: Medicaid Other | Admitting: Family Medicine

## 2021-04-18 ENCOUNTER — Other Ambulatory Visit: Payer: Self-pay

## 2021-04-18 ENCOUNTER — Encounter: Payer: Self-pay | Admitting: Family Medicine

## 2021-04-18 DIAGNOSIS — F1120 Opioid dependence, uncomplicated: Secondary | ICD-10-CM

## 2021-04-18 DIAGNOSIS — O99323 Drug use complicating pregnancy, third trimester: Secondary | ICD-10-CM

## 2021-04-18 DIAGNOSIS — Z720 Tobacco use: Secondary | ICD-10-CM | POA: Diagnosis not present

## 2021-04-18 DIAGNOSIS — F1190 Opioid use, unspecified, uncomplicated: Secondary | ICD-10-CM | POA: Diagnosis not present

## 2021-04-18 MED ORDER — SUBOXONE 2-0.5 MG SL FILM: 2.0000 | ORAL_FILM | Freq: Two times a day (BID) | SUBLINGUAL | 0 refills | Status: DC

## 2021-04-18 MED ORDER — NICOTINE POLACRILEX 2 MG MT GUM: 2.0000 mg | CHEWING_GUM | OROMUCOSAL | 2 refills | Status: DC | PRN

## 2021-04-18 MED ORDER — NICOTINE 21-14-7 MG/24HR TD KIT: 1.0000 | PACK | TRANSDERMAL | 0 refills | Status: DC | PRN

## 2021-04-18 NOTE — Progress Notes (Signed)
Post Partum Visit Note  Dawn Huff is a 26 y.o. [phone] female who presents for a postpartum visit. She is 8 weeks postpartum following a primary cesarean section.  I have fully reviewed the prenatal and intrapartum course. The delivery was at [redacted]w[redacted]d gestational weeks.  Anesthesia: epidural. Postpartum course has been so so. Baby is doing well. Baby is feeding by bottle - Gerber Gentle . Bleeding no bleeding. Bowel function is normal. Bladder function is normal. Patient is not sexually active. Contraception method is none. Postpartum depression screening: negative.   The pregnancy intention screening data noted above was reviewed. Potential methods of contraception were discussed. The patient elected to proceed with No data recorded.   Edinburgh Postnatal Depression Scale - 04/18/21 1145       Edinburgh Postnatal Depression Scale:  In the Past 7 Days   I have been able to laugh and see the funny side of things. 0    I have looked forward with enjoyment to things. 0    I have blamed myself unnecessarily when things went wrong. 1    I have been anxious or worried for no good reason. 1    I have felt scared or panicky for no good reason. 1    Things have been getting on top of me. 0    I have been so unhappy that I have had difficulty sleeping. 0    I have felt sad or miserable. 1    I have been so unhappy that I have been crying. 1    The thought of harming myself has occurred to me. 0    Edinburgh Postnatal Depression Scale Total 5             Health Maintenance Due  Topic Date Due   COVID-19 Vaccine (1) Never done   HPV VACCINES (1 - 2-dose series) Never done    The following portions of the patient's history were reviewed and updated as appropriate: allergies, current medications, past family history, past medical history, past social history, past surgical history, and problem list.  Review of Systems Pertinent items noted in HPI and remainder of comprehensive ROS  otherwise negative.  Objective:  BP 134/88    Pulse 96    Wt 187 lb (84.8 kg)    LMP 04/16/2020 (Exact Date)    Breastfeeding No    BMI 36.52 kg/m    General:  alert, cooperative, and appears stated age   Breasts:  not indicated  Lungs: Comfortable on room air  Wound well approximated incision  GU exam:  normal       Assessment:    There are no diagnoses linked to this encounter.  Normal postpartum exam.   Plan:   Essential components of care per ACOG recommendations:  1.  Mood and well being: Patient with negative depression screening today. Reviewed local resources for support.  - Patient tobacco use? Yes. Patient desires to quit? Yes.offered nicotine replacement therapy which was accepted.  - hx of drug use? Yes. Discussed support systems and outpatient/inpatient treatment options.    2. Infant care and feeding:  -Patient currently breastmilk feeding? No.  -Social determinants of health (SDOH) reviewed in EPIC. No concerns  3. Sexuality, contraception and birth spacing - Patient does not want a pregnancy in the next year.  Desired family size is 1 children.  - Reviewed forms of contraception in tiered fashion. Patient desired abstinence today.   - Discussed birth spacing of 18 months  4.  Sleep and fatigue -Encouraged family/partner/community support of 4 hrs of uninterrupted sleep to help with mood and fatigue  5. Physical Recovery  - Discussed patients delivery and complications. She describes her labor as bad. - Patient had a C-section failure to progress.  - Patient has urinary incontinence? No. - Patient is safe to resume physical and sexual activity  6.  Health Maintenance - HM due items addressed Yes - Last pap smear  Diagnosis  Date Value Ref Range Status  07/30/2020   Final   - Negative for intraepithelial lesion or malignancy (NILM)   Pap smear not done at today's visit.  -Breast Cancer screening indicated? No.   7. Chronic Disease/Pregnancy Condition  follow up: Hypertension and OUD - normotensive, not on nifedipine anymore - Feeling cravings, increased suboxone to 4 mg BID, UDS today, follow up in 2 weeks - PCP follow up  Venora Maples, MD Center for [facility], [facility]

## 2021-04-24 LAB — TOXASSURE SELECT 13 (MW), URINE

## 2021-05-06 ENCOUNTER — Telehealth: Payer: Self-pay | Admitting: Family Medicine

## 2021-05-06 ENCOUNTER — Ambulatory Visit: Payer: Medicaid Other | Admitting: Family Medicine

## 2021-05-06 ENCOUNTER — Other Ambulatory Visit: Payer: Self-pay | Admitting: Family Medicine

## 2021-05-06 ENCOUNTER — Encounter: Payer: Self-pay | Admitting: Family Medicine

## 2021-05-06 DIAGNOSIS — F1120 Opioid dependence, uncomplicated: Secondary | ICD-10-CM

## 2021-05-06 DIAGNOSIS — O99323 Drug use complicating pregnancy, third trimester: Secondary | ICD-10-CM

## 2021-05-06 DIAGNOSIS — F1190 Opioid use, unspecified, uncomplicated: Secondary | ICD-10-CM

## 2021-05-06 MED ORDER — SUBOXONE 2-0.5 MG SL FILM: 2.0000 | ORAL_FILM | Freq: Two times a day (BID) | SUBLINGUAL | 0 refills | Status: DC

## 2021-05-06 NOTE — Telephone Encounter (Signed)
Patient called and stated that she was going toi be unable to attend her appointment today. It was rescheduled for 05/20/21. Can you call patient regarding her medication.

## 2021-05-06 NOTE — Telephone Encounter (Signed)
Spoke with patient on the phone. After review I had made an error in her last prescription and inadvertently sent her far fewer films than necessary. Apologized, correct rx sent to her pharmacy. Reports she was unable to come to her visit today due to court date she could not miss, will see her in two weeks.

## 2021-05-06 NOTE — Telephone Encounter (Signed)
Attempted to call patient, no answer. Will try again later today.

## 2021-05-20 ENCOUNTER — Ambulatory Visit: Payer: Medicaid Other | Admitting: Family Medicine

## 2021-05-21 ENCOUNTER — Encounter: Payer: Self-pay | Admitting: Family Medicine

## 2021-05-27 ENCOUNTER — Other Ambulatory Visit: Payer: Self-pay | Admitting: Family Medicine

## 2021-05-27 ENCOUNTER — Encounter: Payer: Self-pay | Admitting: Family Medicine

## 2021-05-27 DIAGNOSIS — F1190 Opioid use, unspecified, uncomplicated: Secondary | ICD-10-CM

## 2021-05-27 DIAGNOSIS — F1120 Opioid dependence, uncomplicated: Secondary | ICD-10-CM

## 2021-05-27 MED ORDER — SUBOXONE 2-0.5 MG SL FILM: 2.0000 | ORAL_FILM | Freq: Two times a day (BID) | SUBLINGUAL | 0 refills | Status: DC

## 2021-06-01 ENCOUNTER — Encounter: Payer: Self-pay | Admitting: Family Medicine

## 2021-06-02 ENCOUNTER — Telehealth: Payer: Self-pay

## 2021-06-02 NOTE — Telephone Encounter (Signed)
PA request received for Suboxone films. Called pharmacy. These were successfully billed through insurance on 05/28/21. PA no longer needed. ?

## 2021-06-03 ENCOUNTER — Other Ambulatory Visit: Payer: Self-pay

## 2021-06-03 ENCOUNTER — Ambulatory Visit: Payer: Medicaid Other | Admitting: Family Medicine

## 2021-06-03 ENCOUNTER — Ambulatory Visit (INDEPENDENT_AMBULATORY_CARE_PROVIDER_SITE_OTHER): Payer: Medicaid Other | Admitting: Family Medicine

## 2021-06-03 VITALS — BP 107/81 | HR 99 | Wt 185.4 lb

## 2021-06-03 DIAGNOSIS — O99323 Drug use complicating pregnancy, third trimester: Secondary | ICD-10-CM

## 2021-06-03 DIAGNOSIS — F1190 Opioid use, unspecified, uncomplicated: Secondary | ICD-10-CM

## 2021-06-03 DIAGNOSIS — F32A Depression, unspecified: Secondary | ICD-10-CM

## 2021-06-03 DIAGNOSIS — F419 Anxiety disorder, unspecified: Secondary | ICD-10-CM | POA: Diagnosis not present

## 2021-06-03 DIAGNOSIS — F1120 Opioid dependence, uncomplicated: Secondary | ICD-10-CM

## 2021-06-03 MED ORDER — BUSPIRONE HCL 10 MG PO TABS: 10.0000 mg | ORAL_TABLET | Freq: Three times a day (TID) | ORAL | 2 refills | Status: DC | PRN

## 2021-06-03 MED ORDER — SERTRALINE HCL 50 MG PO TABS: 50.0000 mg | ORAL_TABLET | Freq: Every day | ORAL | 2 refills | Status: DC

## 2021-06-03 MED ORDER — SUBOXONE 8-2 MG SL FILM: ORAL_FILM | SUBLINGUAL | 0 refills | Status: DC

## 2021-06-03 NOTE — Progress Notes (Signed)
? ?GYNECOLOGY OFFICE VISIT NOTE ? ?History:  ? Dawn Huff is a 26 y.o. [phone] here today for postpartum suboxone follow up. ? ?Last seen 04/18/2021 for postpartum visit ?At that time with increased cravings, increased to 4mg  BID of Suboxone ?UDS was appropriate ?Also given NRT to help quit smoking ? ?Today reports she is still taking suboxone 4mg  BID, dose is working well ?CPS case was closed recently which she is very happy about ? ?Reports she has been lying on her PHQ's ?Having lots of anxiety and stress ?Mother is a constant issue, always telling her how to care for the baby ?Wants her to put her down and let her cry things out but she prefers to be more hands on ?Has started going back to counseling ?Wondering about medication therapy ? ?Health Maintenance Due  ?Topic Date Due  ? COVID-19 Vaccine (1) Never done  ? HPV VACCINES (1 - 2-dose series) Never done  ? ? ?Past Medical History:  ?Diagnosis Date  ? ADHD (attention deficit hyperactivity disorder)   ? Contraceptive management 03/13/2013  ? IBS (irritable bowel syndrome)   ? Irregular bleeding 03/10/2013  ? Lactose intolerance   ? Low-lying placenta 09/23/2020  ? 1.4 cm from os on initial anatomy scan Resolved 10/18/20  ? ? ?Past Surgical History:  ?Procedure Laterality Date  ? CESAREAN SECTION  02/16/2021  ? Procedure: CESAREAN SECTION;  Surgeon: 10/20/20, MD;  Location: [facility] LD [facility]S;  Service: Obstetrics;;  ? NO PAST SURGERIES    ? ? ?The following portions of the patient's history were reviewed and updated as appropriate: allergies, current medications, past family history, past medical history, past social history, past surgical history and problem list.  ? ?Health Maintenance:   ?Last pap: ?Lab Results  ?Component Value Date  ? DIAGPAP  07/30/2020  ?  - Negative for intraepithelial lesion or malignancy (NILM)  ? ? ?Last mammogram:  ?N/a  ? ? ?Review of Systems:  ?Pertinent items noted in HPI and remainder of comprehensive ROS otherwise  negative. ? ?Physical Exam:  ?BP 107/81   Pulse 99   Wt 185 lb 6.4 oz (84.1 kg)   LMP 06/01/2021 (Exact Date)   Breastfeeding No   BMI 36.21 kg/m?  ?CONSTITUTIONAL: Well-developed, well-nourished female in no acute distress.  ?HEENT:  Normocephalic, atraumatic. External right and left ear normal. No scleral icterus.  ?NECK: Normal range of motion, supple, no masses noted on observation ?SKIN: No rash noted. Not diaphoretic. No erythema. No pallor. ?MUSCULOSKELETAL: Normal range of motion. No edema noted. ?NEUROLOGIC: Alert and oriented to person, place, and time. Normal muscle tone coordination.  ?PSYCHIATRIC: Normal mood and affect. Normal behavior. Normal judgment and thought content. ?RESPIRATORY: Effort normal, no problems with respiration noted ? ?Labs and Imaging ?No results found for this or any previous visit (from the past 168 hour(s)). ?No results found.    ?Assessment and Plan:  ? ?Problem List Items Addressed This Visit   ? ?  ? Other  ? Opioid use disorder - Primary  ?  Stable ?Switched Rx to 8 mg films, will take 1/2 film BID ?  ?  ? Relevant Medications  ? SUBOXONE 8-2 MG FILM  ? Other Relevant Orders  ? ToxASSURE Select 13 (MW), Urine  ? Anxiety and depression  ?  09/29/2020 for her honesty and agreed that therapy is a good idea. Offered and accepted zoloft and buspar, discussed expectations and likely side effects for both.  ?  ?  ?  Relevant Medications  ? sertraline (ZOLOFT) 50 MG tablet  ? busPIRone (BUSPAR) 10 MG tablet  ? ?Other Visit Diagnoses   ? ? Suboxone maintenance treatment complicating pregnancy, antepartum, third trimester (HCC)      ? ?  ? ? ?Routine preventative health maintenance measures emphasized. ?Please refer to After Visit Summary for other counseling recommendations.  ? ?Return in about 4 weeks (around 07/01/2021) for suboxone, depression check in.   ? ?Total face-to-face time with patient: 20 minutes.  Over 50% of encounter was spent on counseling and coordination of  care. ? ? ?Venora Maples, MD/MPH ?Attending Family Medicine Physician, Faculty Practice ?Center for [employer], [facility] ? ?

## 2021-06-03 NOTE — Assessment & Plan Note (Signed)
Dawn Huff for her honesty and agreed that therapy is a good idea. Offered and accepted zoloft and buspar, discussed expectations and likely side effects for both.  ?

## 2021-06-03 NOTE — Assessment & Plan Note (Signed)
Stable ?Switched Rx to 8 mg films, will take 1/2 film BID ?

## 2021-06-04 ENCOUNTER — Encounter: Payer: Self-pay | Admitting: Family Medicine

## 2021-06-07 LAB — TOXASSURE SELECT 13 (MW), URINE

## 2021-07-02 ENCOUNTER — Encounter: Payer: Self-pay | Admitting: Family Medicine

## 2021-07-07 ENCOUNTER — Encounter: Payer: Medicaid Other | Admitting: Family Medicine

## 2021-07-08 ENCOUNTER — Encounter: Payer: Self-pay | Admitting: Family Medicine

## 2021-07-08 ENCOUNTER — Ambulatory Visit (INDEPENDENT_AMBULATORY_CARE_PROVIDER_SITE_OTHER): Payer: Medicaid Other | Admitting: Family Medicine

## 2021-07-08 VITALS — BP 117/60 | HR 67 | Ht 60.0 in | Wt 190.8 lb

## 2021-07-08 DIAGNOSIS — F1190 Opioid use, unspecified, uncomplicated: Secondary | ICD-10-CM

## 2021-07-08 DIAGNOSIS — F32A Depression, unspecified: Secondary | ICD-10-CM

## 2021-07-08 DIAGNOSIS — F419 Anxiety disorder, unspecified: Secondary | ICD-10-CM | POA: Diagnosis not present

## 2021-07-08 MED ORDER — SERTRALINE HCL 100 MG PO TABS: 100.0000 mg | ORAL_TABLET | Freq: Every day | ORAL | 3 refills | Status: DC

## 2021-07-08 MED ORDER — SUBOXONE 8-2 MG SL FILM: ORAL_FILM | SUBLINGUAL | 0 refills | Status: DC

## 2021-07-08 MED ORDER — HYDROXYZINE PAMOATE 50 MG PO CAPS: 50.0000 mg | ORAL_CAPSULE | Freq: Three times a day (TID) | ORAL | 5 refills | Status: DC | PRN

## 2021-07-08 NOTE — Progress Notes (Signed)
? ?GYNECOLOGY OFFICE VISIT NOTE ? ?History:  ? Dawn Huff is a 26 y.o. [phone] here today for follow up of suboxone and mood. ? ?Reports that buspar is not helping with anxiety ?Still taking zoloft, only at 50 mg currently ?Is engaged in counseling at present ? ?Suboxone is doing ok ?She is on 4 mg BID but feels like she may need to go up a bit ? ?Adorable baby girl is with her today ? ?Health Maintenance Due  ?Topic Date Due  ? COVID-19 Vaccine (1) Never done  ? HPV VACCINES (1 - 2-dose series) Never done  ? ? ?Past Medical History:  ?Diagnosis Date  ? ADHD (attention deficit hyperactivity disorder)   ? Contraceptive management 03/13/2013  ? IBS (irritable bowel syndrome)   ? Irregular bleeding 03/10/2013  ? Lactose intolerance   ? Low-lying placenta 09/23/2020  ? 1.4 cm from os on initial anatomy scan Resolved 10/18/20  ? ? ?Past Surgical History:  ?Procedure Laterality Date  ? CESAREAN SECTION  02/16/2021  ? Procedure: CESAREAN SECTION;  Surgeon: Reva Bores, MD;  Location: [facility] LD ORS;  Service: Obstetrics;;  ? NO PAST SURGERIES    ? ? ?The following portions of the patient's history were reviewed and updated as appropriate: allergies, current medications, past family history, past medical history, past social history, past surgical history and problem list.  ? ?Health Maintenance:   ?Last pap: ?Lab Results  ?Component Value Date  ? DIAGPAP  07/30/2020  ?  - Negative for intraepithelial lesion or malignancy (NILM)  ? ? ?Last mammogram:  ?N/a  ? ? ?Review of Systems:  ?Pertinent items noted in HPI and remainder of comprehensive ROS otherwise negative. ? ?Physical Exam:  ?BP 117/60   Pulse 67   Ht 5' (1.524 m)   Wt 190 lb 12.8 oz (86.5 kg)   LMP 04/21/2021   BMI 37.26 kg/m?  ?CONSTITUTIONAL: Well-developed, well-nourished female in no acute distress.  ?HEENT:  Normocephalic, atraumatic. External right and left ear normal. No scleral icterus.  ?NECK: Normal range of motion, supple, no masses noted on  observation ?SKIN: No rash noted. Not diaphoretic. No erythema. No pallor. ?MUSCULOSKELETAL: Normal range of motion. No edema noted. ?NEUROLOGIC: Alert and oriented to person, place, and time. Normal muscle tone coordination.  ?PSYCHIATRIC: Depressed mood and normal affect. Normal behavior. Normal judgment and thought content. ?RESPIRATORY: Effort normal, no problems with respiration noted ? ? ?Labs and Imaging ?No results found for this or any previous visit (from the past 168 hour(s)). ?No results found.    ?Assessment and Plan:  ? ?Problem List Items Addressed This Visit   ? ?  ? Other  ? Opioid use disorder - Primary  ?  UDS today ?Increase to 4 mg TID, also okay to do 4/4/2 if she prefers to go slower and see how she feels ?  ?  ? Relevant Medications  ? SUBOXONE 8-2 MG FILM  ? Other Relevant Orders  ? ToxASSURE Select 13 (MW), Urine  ? Anxiety and depression  ?  Increase zoloft to 100 mg daily, d/c buspar and trial vistaril. Great that she is doing counseling as well, encouraged to continue.  ?  ?  ? Relevant Medications  ? sertraline (ZOLOFT) 100 MG tablet  ? hydrOXYzine (VISTARIL) 50 MG capsule  ? ? ?Routine preventative health maintenance measures emphasized. ?Please refer to After Visit Summary for other counseling recommendations.  ? ?Return in about 4 weeks (around 08/05/2021) for OUD, mood check.   ? ?  Total face-to-face time with patient: 15 minutes.  Over 50% of encounter was spent on counseling and coordination of care. ? ? ?Venora Maples, MD/MPH ?Attending Family Medicine Physician, Faculty Practice ?Center for [employer], [facility] ? ?

## 2021-07-08 NOTE — Assessment & Plan Note (Signed)
Increase zoloft to 100 mg daily, d/c buspar and trial vistaril. Great that she is doing counseling as well, encouraged to continue.  ?

## 2021-07-08 NOTE — Assessment & Plan Note (Signed)
UDS today ?Increase to 4 mg TID, also okay to do 4/4/2 if she prefers to go slower and see how she feels ?

## 2021-07-13 LAB — TOXASSURE SELECT 13 (MW), URINE

## 2021-07-14 ENCOUNTER — Other Ambulatory Visit: Payer: Self-pay | Admitting: Family Medicine

## 2021-07-14 ENCOUNTER — Encounter: Payer: Self-pay | Admitting: Family Medicine

## 2021-07-14 DIAGNOSIS — F32A Depression, unspecified: Secondary | ICD-10-CM

## 2021-07-14 MED ORDER — GABAPENTIN 300 MG PO CAPS: 300.0000 mg | ORAL_CAPSULE | Freq: Two times a day (BID) | ORAL | 5 refills | Status: DC

## 2021-07-14 NOTE — Progress Notes (Signed)
Vistaril not effective for anxiety, trial gabapentin in combination with sertraline. ?

## 2021-08-06 ENCOUNTER — Encounter: Payer: Medicaid Other | Admitting: Family Medicine

## 2021-08-06 NOTE — Progress Notes (Deleted)
   GYNECOLOGY OFFICE VISIT NOTE  History:   Zetha Kuhar is a 26 y.o. [phone] here today for follow up of anxiety/depression and opioid use disorder in the fourth trimester.  Last seen on 07/08/2021, at that time reported she still had significant anxiety symptoms. Zoloft was increased to 100 mg daily, we d/c'd buspar and tried vistaril. She got in touch a few days later and said that wasn't working either so she was sent gabapentin for anxiety to be taken in conjunction with zoloft. We also increased her Suboxone to 4 mg TID UDS was appropriate  Today reports anxiety is ***  Suboxone is ***  Health Maintenance Due  Topic Date Due   COVID-19 Vaccine (1) Never done   HPV VACCINES (1 - 2-dose series) Never done    Past Medical History:  Diagnosis Date   ADHD (attention deficit hyperactivity disorder)    Contraceptive management 03/13/2013   IBS (irritable bowel syndrome)    Irregular bleeding 03/10/2013   Lactose intolerance    Low-lying placenta 09/23/2020   1.4 cm from os on initial anatomy scan Resolved 10/18/20    Past Surgical History:  Procedure Laterality Date   CESAREAN SECTION  02/16/2021   Procedure: CESAREAN SECTION;  Surgeon: Reva Bores, MD;  Location: [facility] LD [facility]S;  Service: Obstetrics;;   NO PAST SURGERIES      The following portions of the patient's history were reviewed and updated as appropriate: allergies, current medications, past family history, past medical history, past social history, past surgical history and problem list.   Health Maintenance:   Last pap: Lab Results  Component Value Date   DIAGPAP  07/30/2020    - Negative for intraepithelial lesion or malignancy (NILM)     Last mammogram:  N/a    Review of Systems:  Pertinent items noted in HPI and remainder of comprehensive ROS otherwise negative.  Physical Exam:  There were no vitals taken for this visit. CONSTITUTIONAL: Well-developed, well-nourished female in no acute distress.   HEENT:  Normocephalic, atraumatic. External right and left ear normal. No scleral icterus.  NECK: Normal range of motion, supple, no masses noted on observation SKIN: No rash noted. Not diaphoretic. No erythema. No pallor. MUSCULOSKELETAL: Normal range of motion. No edema noted. NEUROLOGIC: Alert and oriented to person, place, and time. Normal muscle tone coordination.  PSYCHIATRIC: Normal mood and affect. Normal behavior. Normal judgment and thought content. RESPIRATORY: Effort normal, no problems with respiration noted ABDOMEN: No masses noted. No other overt distention noted.  *** PELVIC: {Blank single:19197::"Deferred","Normal appearing external genitalia; normal appearing vaginal mucosa and cervix.  No abnormal discharge noted.  Normal uterine size, no other palpable masses, no uterine or adnexal tenderness."}  Labs and Imaging No results found for this or any previous visit (from the past 168 hour(s)). No results found.    Assessment and Plan:   Problem List Items Addressed This Visit       Other   Opioid use disorder - Primary   Anxiety and depression    Routine preventative health maintenance measures emphasized. Please refer to After Visit Summary for other counseling recommendations.   No follow-ups on file.      Total face-to-face time with patient: {Blank single:19197::"10","15","20","25","30"} minutes.  Over 50% of encounter was spent on counseling and coordination of care.   Venora Maples, MD/MPH Attending Family Medicine Physician, [facility] for [facility], [facility]

## 2021-08-27 ENCOUNTER — Other Ambulatory Visit: Payer: Self-pay | Admitting: Family Medicine

## 2021-08-27 DIAGNOSIS — F1190 Opioid use, unspecified, uncomplicated: Secondary | ICD-10-CM

## 2021-08-28 MED ORDER — SUBOXONE 8-2 MG SL FILM: ORAL_FILM | SUBLINGUAL | 0 refills | Status: DC

## 2021-09-19 ENCOUNTER — Encounter: Payer: Self-pay | Admitting: Family Medicine

## 2021-09-22 ENCOUNTER — Telehealth: Payer: Self-pay

## 2021-09-24 ENCOUNTER — Telehealth (INDEPENDENT_AMBULATORY_CARE_PROVIDER_SITE_OTHER): Payer: Medicaid Other | Admitting: Family Medicine

## 2021-09-24 DIAGNOSIS — F32A Depression, unspecified: Secondary | ICD-10-CM | POA: Diagnosis not present

## 2021-09-24 DIAGNOSIS — F1190 Opioid use, unspecified, uncomplicated: Secondary | ICD-10-CM

## 2021-09-24 DIAGNOSIS — Z3009 Encounter for other general counseling and advice on contraception: Secondary | ICD-10-CM | POA: Diagnosis not present

## 2021-09-24 DIAGNOSIS — F419 Anxiety disorder, unspecified: Secondary | ICD-10-CM

## 2021-09-24 MED ORDER — SUBOXONE 8-2 MG SL FILM: ORAL_FILM | SUBLINGUAL | 0 refills | Status: DC

## 2021-09-24 NOTE — Assessment & Plan Note (Signed)
After discussion would like Nexplanon, will schedule in person at next visit to place this, not currently sexually active.

## 2021-09-24 NOTE — Progress Notes (Signed)
I connected with  Dawn Huff on 09/24/21 at  1:35 PM EDT by MyChart Virtual Video Visit and verified that I am speaking with the correct person using two identifiers.   I discussed the limitations, risks, security and privacy concerns of performing an evaluation and management service by telephone and the availability of in person appointments. I also discussed with the patient that there may be a patient responsible charge related to this service. The patient expressed understanding and agreed to proceed.  Guy Begin, CMA 09/24/2021  1:33 PM

## 2021-09-24 NOTE — Assessment & Plan Note (Signed)
Stable on 4 mg TID Refill sent UDS at next in person visit

## 2021-09-24 NOTE — Assessment & Plan Note (Signed)
Treatment resistant anxiety and depression. Currently on sertraline 100mg  and gabapentin 300mg  BID. Discussed with her that we are starting to reach the edge of my comfort/ability to treat her depression. At this point we have several options and I'm not sure which is the best. We could - increase sertraline dose - add on wellbutrin - stop zoloft and switch to abilify Given multiple options that I have already tried (see prior notes) I recommend she establish care with Psychiatry so she can have someone more knowledgeable managing her medications. In the meantime while I refer her I will discuss with a Psych colleague via staff message to see what option would be the best.

## 2021-09-24 NOTE — Progress Notes (Signed)
Patient stated that she needs a increase in dosage for Gabapentin.   Sherlon informed me that her period started yesterday.

## 2021-09-24 NOTE — Progress Notes (Signed)
GYNECOLOGY VIRTUAL VISIT ENCOUNTER NOTE  Provider location: Center for [facility]   Patient location: Home  I connected with Dawn Huff on 09/24/21 at  1:35 PM EDT by MyChart Video Encounter and verified that I am speaking with the correct person using two identifiers.   I discussed the limitations, risks, security and privacy concerns of performing an evaluation and management service virtually and the availability of in person appointments. I also discussed with the patient that there may be a patient responsible charge related to this service. The patient expressed understanding and agreed to proceed.   History:  Dawn Huff is a 26 y.o. [phone] female being evaluated today for multiple issues.   Last seen 07/08/2021 for anxiety, suboxone follow up At that time suboxone increased to 4 TID, zoloft increased and switched from buspar to vistaril Ultimately vistaril not effective, swithced to gabapentin  Today reports she feels fine on suboxone TID, doesn't want to change dose  Still struggling with anxiety Wondering if Abilify would help, a friend told her it might give her more energy Gabapentin has helped but she is worried it is making her sleepy and having low energy  Wants to get on birth control Thinking about pills Has been on nexplanon before but had lots of bleeding problems       Past Medical History:  Diagnosis Date   ADHD (attention deficit hyperactivity disorder)    Contraceptive management 03/13/2013   IBS (irritable bowel syndrome)    Irregular bleeding 03/10/2013   Lactose intolerance    Low-lying placenta 09/23/2020   1.4 cm from os on initial anatomy scan Resolved 10/18/20   Past Surgical History:  Procedure Laterality Date   CESAREAN SECTION  02/16/2021   Procedure: CESAREAN SECTION;  Surgeon: Reva Bores, MD;  [facility]: [facility];  Service: Obstetrics;;   NO PAST SURGERIES     The following portions of the  patient's history were reviewed and updated as appropriate: allergies, current medications, past family history, past medical history, past social history, past surgical history and problem list.   Health Maintenance:   Lab Results  Component Value Date   DIAGPAP  07/30/2020    - Negative for intraepithelial lesion or malignancy (NILM)     Current Outpatient Medications  Medication Instructions   gabapentin (NEURONTIN) 300 mg, Oral, 2 times daily   ibuprofen (ADVIL) 600 mg, Oral, Every 6 hours PRN   nicotine polacrilex (NICORETTE) 2 mg, Oral, As needed   Prenatal Vit-Fe Fumarate-FA (M-NATAL PLUS) 27-1 MG TABS 1 tablet, Oral, Daily   sertraline (ZOLOFT) 100 mg, Oral, Daily   SUBOXONE 8-2 MG FILM Take 1/2 film every 8 hours     Review of Systems:  Pertinent items noted in HPI and remainder of comprehensive ROS otherwise negative.  Physical Exam:   General:  Alert, oriented and cooperative. Patient appears to be in no acute distress.  Mental Status: Anxious mood and affect. Normal behavior. Normal judgment and thought content.   Respiratory: Normal respiratory effort, no problems with respiration noted  Rest of physical exam deferred due to type of encounter  Labs and Imaging No results found for this or any previous visit (from the past 336 hour(s)). No results found.     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 Currently on sertraline 100mg   and gabapentin 300mg  BID. Discussed with her that we are starting to reach the edge of my comfort/ability to treat her depression. At this point we have several options and I'm not sure which is the best. We could - increase sertraline dose - add on wellbutrin - stop zoloft and switch to abilify Given multiple options that I have already tried (see prior notes) I recommend she establish care with Psychiatry  so she can have someone more knowledgeable managing her medications. In the meantime while I refer her I will discuss with a Psych colleague via staff message to see what option would be the best.   Encounter for counseling regarding contraception After discussion would like Nexplanon, will schedule in person at next visit to place this, not currently sexually active.        I discussed the assessment and treatment plan with the patient. The patient was provided an opportunity to ask questions and all were answered. The patient agreed with the plan and demonstrated an understanding of the instructions.   The patient was advised to call back or seek an in-person evaluation/go to the ED if the symptoms worsen or if the condition fails to improve as anticipated.  I provided 15 minutes of face-to-face time during this encounter.   , MD [facility] for [facility], [facility]

## 2021-09-25 ENCOUNTER — Encounter: Payer: Self-pay | Admitting: Family Medicine

## 2021-09-25 DIAGNOSIS — F419 Anxiety disorder, unspecified: Secondary | ICD-10-CM

## 2021-09-25 MED ORDER — SERTRALINE HCL 100 MG PO TABS: 150.0000 mg | ORAL_TABLET | Freq: Every day | ORAL | 3 refills | Status: DC

## 2021-10-02 ENCOUNTER — Other Ambulatory Visit: Payer: Self-pay | Admitting: Family Medicine

## 2021-10-02 DIAGNOSIS — F1190 Opioid use, unspecified, uncomplicated: Secondary | ICD-10-CM

## 2021-10-03 MED ORDER — SUBOXONE 8-2 MG SL FILM: ORAL_FILM | SUBLINGUAL | 0 refills | Status: DC

## 2021-10-06 ENCOUNTER — Other Ambulatory Visit: Payer: Self-pay | Admitting: Family Medicine

## 2021-10-06 ENCOUNTER — Telehealth: Payer: Self-pay | Admitting: *Deleted

## 2021-10-06 ENCOUNTER — Encounter: Payer: Self-pay | Admitting: *Deleted

## 2021-10-06 DIAGNOSIS — F1190 Opioid use, unspecified, uncomplicated: Secondary | ICD-10-CM

## 2021-10-06 NOTE — Telephone Encounter (Signed)
Received refill request for suboxone with possible error or multiple prescription flags- per chart review patient had refill sent in on 10/03/21 . I called [facility] and they verified that prescription was ready for pick up and no errors. I asked if she had other prescriptions for suboxone sent in by any provider. She states one was sent in by Dr. Crissie Reese on 09/24/21 and was on hold . I asked her to leave it on hold and that I would call patient and tell her to pick her 10/03/21 prescription. She states patient can also call and request delivery. I called [facility] twice and heard a message voicemail is full. I sent a MyChart message.  Nancy Fetter

## 2021-10-29 ENCOUNTER — Encounter: Payer: Self-pay | Admitting: Family Medicine

## 2021-10-29 ENCOUNTER — Other Ambulatory Visit: Payer: Self-pay

## 2021-10-29 ENCOUNTER — Ambulatory Visit (INDEPENDENT_AMBULATORY_CARE_PROVIDER_SITE_OTHER): Payer: Medicaid Other | Admitting: Family Medicine

## 2021-10-29 VITALS — BP 132/83 | HR 76 | Wt 186.8 lb

## 2021-10-29 DIAGNOSIS — Z30017 Encounter for initial prescription of implantable subdermal contraceptive: Secondary | ICD-10-CM

## 2021-10-29 DIAGNOSIS — F1190 Opioid use, unspecified, uncomplicated: Secondary | ICD-10-CM | POA: Diagnosis not present

## 2021-10-29 DIAGNOSIS — F419 Anxiety disorder, unspecified: Secondary | ICD-10-CM | POA: Diagnosis not present

## 2021-10-29 DIAGNOSIS — F32A Depression, unspecified: Secondary | ICD-10-CM

## 2021-10-29 DIAGNOSIS — K5903 Drug induced constipation: Secondary | ICD-10-CM

## 2021-10-29 DIAGNOSIS — Z975 Presence of (intrauterine) contraceptive device: Secondary | ICD-10-CM | POA: Diagnosis not present

## 2021-10-29 MED ORDER — ETONOGESTREL 68 MG ~~LOC~~ IMPL
68.0000 mg | DRUG_IMPLANT | Freq: Once | SUBCUTANEOUS | Status: AC
  Administered 2021-10-29: 68 mg via SUBCUTANEOUS

## 2021-10-29 MED ORDER — POLYETHYLENE GLYCOL 3350 17 GM/SCOOP PO POWD: 17.0000 g | Freq: Every day | ORAL | 1 refills | Status: DC | PRN

## 2021-10-29 MED ORDER — SERTRALINE HCL 100 MG PO TABS: 200.0000 mg | ORAL_TABLET | Freq: Every day | ORAL | 3 refills | Status: DC

## 2021-10-29 MED ORDER — SUBOXONE 8-2 MG SL FILM: ORAL_FILM | SUBLINGUAL | 0 refills | Status: DC

## 2021-10-29 NOTE — Progress Notes (Unsigned)
GYNECOLOGY OFFICE VISIT NOTE  History:   Dawn Huff is a 26 y.o. [phone] here today for multiple issues.  Primary concern is mental health Feels like she is still struggling with a lot of anxiety and depression Stopped taking gabapentin as she felt it was too sedating Still taking Zoloft 150 mg and wondering if we should go up [employer] Visit from 10/29/2021 in [facility] [employer] [facility] [facility]  PHQ-9 Total Score 7       Doing well on suboxone Does not need any change in the dose  Endorsing significant constipation Sometimes has painful BM's and has to strain Has seen some blood on stool On [place] stool chart identified stool as constipation type  Would like to get back on birth control Prefers nexplanon   Health Maintenance Due  Topic Date Due   COVID-19 Vaccine (1) Never done   HPV VACCINES (1 - 2-dose series) Never done   INFLUENZA VACCINE  10/28/2021    Past Medical History:  Diagnosis Date   ADHD (attention deficit hyperactivity disorder)    Contraceptive management 03/13/2013   IBS (irritable bowel syndrome)    Irregular bleeding 03/10/2013   Lactose intolerance    Low-lying placenta 09/23/2020   1.4 cm from os on initial anatomy scan Resolved 10/18/20    Past Surgical History:  Procedure Laterality Date   CESAREAN SECTION  02/16/2021   Procedure: CESAREAN SECTION;  Surgeon: Reva Bores, MD;  [facility];  Service: Obstetrics;;   NO PAST SURGERIES      The following portions of the patient's history were reviewed and updated as appropriate: allergies, current medications, past family history, past medical history, past social history, past surgical history and problem list.   Health Maintenance:   Last pap: Lab Results  Component Value Date   DIAGPAP  07/30/2020    - Negative for intraepithelial lesion or malignancy (NILM)     Last mammogram:  N/a    Review of Systems:  Pertinent items  noted in HPI and remainder of comprehensive ROS otherwise negative.  Physical Exam:  BP 132/83   Pulse 76   Wt 186 lb 12.8 oz (84.7 kg)   Breastfeeding No   BMI 36.48 kg/m  CONSTITUTIONAL: Well-developed, well-nourished female in no acute distress.  HEENT:  Normocephalic, atraumatic. External right and left ear normal. No scleral icterus.  NECK: Normal range of motion, supple, no masses noted on observation SKIN: No rash noted. Not diaphoretic. No erythema. No pallor. MUSCULOSKELETAL: Normal range of motion. No edema noted. NEUROLOGIC: Alert and oriented to person, place, and time. Normal muscle tone coordination.  PSYCHIATRIC: Normal mood and affect. Normal behavior. Normal judgment and thought content. RESPIRATORY: Effort normal, no problems with respiration noted   Labs and Imaging Results for orders placed or performed in visit on 10/29/21 (from the past 168 hour(s))  CBC   Collection Time: 10/29/21  9:31 AM  Result Value Ref Range   WBC 9.6 3.4 - 10.8 x10E3/uL   RBC 4.63 3.77 - 5.28 x10E6/uL   Hemoglobin 14.1 11.1 - 15.9 g/dL   Hematocrit 86.7 67.2 - 46.6 %   MCV 90 79 - 97 fL   MCH 30.5 26.6 - 33.0 pg   MCHC 33.9 31.5 - 35.7 g/dL   RDW 09.4 70.9 - 62.8 %   Platelets 293 150 - 450 x10E3/uL  HgB A1c   Collection Time: 10/29/21  9:31 AM  Result Value Ref Range  Hgb A1c MFr Bld 5.2 4.8 - 5.6 %   Est. average glucose Bld gHb Est-mCnc 103 mg/dL  Lipid Profile   Collection Time: 10/29/21  9:31 AM  Result Value Ref Range   Cholesterol, Total 213 (H) 100 - 199 mg/dL   Triglycerides 270 (H) 0 - 149 mg/dL   HDL 31 (L) >35 mg/dL   VLDL Cholesterol Cal 50 (H) 5 - 40 mg/dL   LDL Chol Calc (NIH) 009 (H) 0 - 99 mg/dL   Chol/HDL Ratio 6.9 (H) 0.0 - 4.4 ratio  TSH Rfx on Abnormal to Free T4   Collection Time: 10/29/21  9:36 AM  Result Value Ref Range   TSH 1.810 0.450 - 4.500 uIU/mL   No results found.    Assessment and Plan:   Problem List Items Addressed This Visit        Digestive   Drug-induced constipation    Rx sent for miralax      Relevant Medications   polyethylene glycol powder (GLYCOLAX/MIRALAX) 17 GM/SCOOP powder     Other   Encounter for counseling regarding contraception    Has not been sexually active, would like Nexplanon placed today. See procedure note.       Opioid use disorder - Primary    Stable UDS today Refill sent      Relevant Medications   SUBOXONE 8-2 MG FILM   Anxiety and depression    Ongoing difficult with treatment resistant anxiety/depression. Will increase sertraline to max dose of 200 mg QHS, patient has already d/c gabapentin on her own. Previously consulted with psychiatry colleague, will plan to do Abilify next if dose increase is not effective. In anticipation of this will check metabolic labs. Will follow up in a month.      Relevant Medications   sertraline (ZOLOFT) 100 MG tablet   Other Relevant Orders   CBC (Completed)   TSH Rfx on Abnormal to Free T4 (Completed)   HgB A1c (Completed)   Lipid Profile (Completed)    Routine preventative health maintenance measures emphasized. Please refer to After Visit Summary for other counseling recommendations.   Return in about 4 weeks (around 11/26/2021), or OUD follow up.    Total face-to-face time with patient: 30 minutes.  Over 50% of encounter was spent on counseling and coordination of care.   Venora Maples, MD/MPH Attending Family Medicine Physician, [facility], [facility]

## 2021-10-30 DIAGNOSIS — K5903 Drug induced constipation: Secondary | ICD-10-CM | POA: Insufficient documentation

## 2021-10-30 LAB — LIPID PANEL
Chol/HDL Ratio: 6.9 ratio — ABNORMAL HIGH (ref 0.0–4.4)
Cholesterol, Total: 213 mg/dL — ABNORMAL HIGH (ref 100–199)
HDL: 31 mg/dL — ABNORMAL LOW (ref >39)
LDL Chol Calc (NIH): 132 mg/dL — ABNORMAL HIGH (ref 0–99)
Triglycerides: 278 mg/dL — ABNORMAL HIGH (ref 0–149)
VLDL Cholesterol Cal: 50 mg/dL — ABNORMAL HIGH (ref 5–40)

## 2021-10-30 LAB — CBC
Hematocrit: 41.6 % (ref 34.0–46.6)
Hemoglobin: 14.1 g/dL (ref 11.1–15.9)
MCH: 30.5 pg (ref 26.6–33.0)
MCHC: 33.9 g/dL (ref 31.5–35.7)
MCV: 90 fL (ref 79–97)
Platelets: 293 10*3/uL (ref 150–450)
RBC: 4.63 x10E6/uL (ref 3.77–5.28)
RDW: 13.2 % (ref 11.7–15.4)
WBC: 9.6 10*3/uL (ref 3.4–10.8)

## 2021-10-30 LAB — HEMOGLOBIN A1C
Est. average glucose Bld gHb Est-mCnc: 103 mg/dL
Hgb A1c MFr Bld: 5.2 % (ref 4.8–5.6)

## 2021-10-30 LAB — TSH RFX ON ABNORMAL TO FREE T4: TSH: 1.81 u[IU]/mL (ref 0.450–4.500)

## 2021-10-30 NOTE — Assessment & Plan Note (Signed)
Rx sent for miralax

## 2021-10-30 NOTE — Progress Notes (Signed)
     GYNECOLOGY OFFICE PROCEDURE NOTE  Dawn Huff is a 26 y.o. [phone] here for Nexplanon insertion.  Last pap smear was normal: Lab Results  Component Value Date   DIAGPAP  07/30/2020    - Negative for intraepithelial lesion or malignancy (NILM)    Nexplanon insertion Procedure Patient identified, informed consent performed, consent signed.   Patient does understand that irregular bleeding is a very common side effect of this medication. She was advised to have backup contraception for one week after placement. Pregnancy test in clinic today was negative.  Appropriate time out taken.  Patient's left arm was prepped and draped in the usual sterile fashion. The ruler used to measure and mark insertion area.  Patient was prepped with alcohol swab and then injected with 3 ml of 1% lidocaine.  She was prepped with betadine, Nexplanon removed from packaging,  Device confirmed in needle, then inserted full length of needle and withdrawn per handbook instructions. Nexplanon was able to palpated in the patient's arm; patient palpated the insert herself. There was minimal blood loss.  Patient insertion site covered with guaze and a pressure bandage to reduce any bruising.  The patient tolerated the procedure well and was given post procedure instructions.  Venora Maples, MD/MPH Family Medicine, [facility] for [employer], [facility]

## 2021-10-30 NOTE — Assessment & Plan Note (Signed)
Ongoing difficult with treatment resistant anxiety/depression. Will increase sertraline to max dose of 200 mg QHS, patient has already d/c gabapentin on her own. Previously consulted with psychiatry colleague, will plan to do Abilify next if dose increase is not effective. In anticipation of this will check metabolic labs. Will follow up in a month.

## 2021-10-30 NOTE — Assessment & Plan Note (Signed)
Stable UDS today Refill sent

## 2021-10-30 NOTE — Assessment & Plan Note (Signed)
Has not been sexually active, would like Nexplanon placed today. See procedure note.

## 2021-11-26 ENCOUNTER — Ambulatory Visit: Payer: Medicaid Other | Admitting: Family Medicine

## 2021-11-28 ENCOUNTER — Encounter: Payer: Self-pay | Admitting: Family Medicine

## 2021-12-17 ENCOUNTER — Other Ambulatory Visit: Payer: Self-pay

## 2021-12-17 ENCOUNTER — Ambulatory Visit (INDEPENDENT_AMBULATORY_CARE_PROVIDER_SITE_OTHER): Payer: Medicaid Other | Admitting: Family Medicine

## 2021-12-17 ENCOUNTER — Encounter: Payer: Self-pay | Admitting: Family Medicine

## 2021-12-17 VITALS — BP 137/89 | HR 91 | Wt 182.9 lb

## 2021-12-17 DIAGNOSIS — F1190 Opioid use, unspecified, uncomplicated: Secondary | ICD-10-CM

## 2021-12-17 DIAGNOSIS — F32A Depression, unspecified: Secondary | ICD-10-CM | POA: Diagnosis not present

## 2021-12-17 DIAGNOSIS — F419 Anxiety disorder, unspecified: Secondary | ICD-10-CM | POA: Diagnosis not present

## 2021-12-17 MED ORDER — ARIPIPRAZOLE 5 MG PO TABS: 5.0000 mg | ORAL_TABLET | Freq: Every day | ORAL | 5 refills | Status: DC

## 2021-12-17 MED ORDER — SUBOXONE 8-2 MG SL FILM: ORAL_FILM | SUBLINGUAL | 0 refills | Status: DC

## 2021-12-17 NOTE — Progress Notes (Signed)
GYNECOLOGY OFFICE VISIT NOTE  History:   Dawn Huff is a 26 y.o. [phone] here today for depression/anxiety and OUD follow up.  Doing ok after going up to Sertraline 200mg  daily but still having symptoms of anxiety Per our discussion last time she would like to try Abilify Still fighting a lot with her Mom who just wants her to stop all her meds  Also frustrated with Peds provider for [facility] Wanting to go to a new office  Health Maintenance Due  Topic Date Due   COVID-19 Vaccine (1) Never done   HPV VACCINES (1 - 2-dose series) Never done   INFLUENZA VACCINE  Never done    Past Medical History:  Diagnosis Date   ADHD (attention deficit hyperactivity disorder)    Contraceptive management 03/13/2013   IBS (irritable bowel syndrome)    Irregular bleeding 03/10/2013   Lactose intolerance    Low-lying placenta 09/23/2020   1.4 cm from os on initial anatomy scan Resolved 10/18/20    Past Surgical History:  Procedure Laterality Date   CESAREAN SECTION  02/16/2021   Procedure: CESAREAN SECTION;  Surgeon: Donnamae Jude, MD;  [facility];  Service: Obstetrics;;   NO PAST SURGERIES      The following portions of the patient's history were reviewed and updated as appropriate: allergies, current medications, past family history, past medical history, past social history, past surgical history and problem list.   Health Maintenance:   Last pap: Lab Results  Component Value Date   DIAGPAP  07/30/2020    - Negative for intraepithelial lesion or malignancy (NILM)     Last mammogram:  N/a    Review of Systems:  Pertinent items noted in HPI and remainder of comprehensive ROS otherwise negative.  Physical Exam:  BP 137/89   Pulse 91   Wt 182 lb 14.4 oz (83 kg)   LMP  (LMP Unknown)   Breastfeeding No   BMI 35.72 kg/m  CONSTITUTIONAL: Well-developed, well-nourished female in no acute distress.  HEENT:  Normocephalic, atraumatic. External right and left ear  normal. No scleral icterus.  NECK: Normal range of motion, supple, no masses noted on observation SKIN: No rash noted. Not diaphoretic. No erythema. No pallor. MUSCULOSKELETAL: Normal range of motion. No edema noted. NEUROLOGIC: Alert and oriented to person, place, and time. Normal muscle tone coordination.  PSYCHIATRIC: Anxious mood and affect. Normal behavior. Normal judgment and thought content. RESPIRATORY: Effort normal, no problems with respiration noted ABDOMEN: No masses noted. No other overt distention noted.   PELVIC: Deferred  Labs and Imaging No results found for this or any previous visit (from the past 168 hour(s)). No results found.    Assessment and Plan:   Problem List Items Addressed This Visit       Other   Opioid use disorder    Doing well. UDS collected. Refill sent.      Relevant Medications   SUBOXONE 8-2 MG FILM   Other Relevant Orders   ToxASSURE Select 13 (MW), Urine   Anxiety and depression - Primary    Add Abilify 5 mg daily in addition to Zoloft 200 mg daily per our previous plan. Has trialed many other meds for anxiety including buspar/vistaril/gabapentin/etc. Will message colleague to see if there is a good psych provider in [facility] I can refer her to.      Relevant Medications   ARIPiprazole (ABILIFY) 5 MG tablet    Routine preventative health maintenance measures emphasized. Please refer to  After Visit Summary for other counseling recommendations.   Return in about 4 weeks (around 01/14/2022) for OUD f/u.      Total face-to-face time with patient: 20 minutes.  Over 50% of encounter was spent on counseling and coordination of care.   Venora Maples, MD/MPH Attending Family Medicine Physician, [facility] for [facility], [facility]

## 2021-12-17 NOTE — Assessment & Plan Note (Signed)
Add Abilify 5 mg daily in addition to Zoloft 200 mg daily per our previous plan. Has trialed many other meds for anxiety including buspar/vistaril/gabapentin/etc. Will message colleague to see if there is a good psych provider in [facility] I can refer her to.

## 2021-12-17 NOTE — Assessment & Plan Note (Signed)
Doing well UDS collected Refill sent 

## 2021-12-21 LAB — TOXASSURE SELECT 13 (MW), URINE

## 2022-01-14 ENCOUNTER — Encounter: Payer: Self-pay | Admitting: Family Medicine

## 2022-01-14 ENCOUNTER — Other Ambulatory Visit: Payer: Self-pay | Admitting: Family Medicine

## 2022-01-14 ENCOUNTER — Telehealth (INDEPENDENT_AMBULATORY_CARE_PROVIDER_SITE_OTHER): Payer: Medicaid Other | Admitting: Family Medicine

## 2022-01-14 DIAGNOSIS — F419 Anxiety disorder, unspecified: Secondary | ICD-10-CM | POA: Diagnosis not present

## 2022-01-14 DIAGNOSIS — N921 Excessive and frequent menstruation with irregular cycle: Secondary | ICD-10-CM

## 2022-01-14 DIAGNOSIS — F1190 Opioid use, unspecified, uncomplicated: Secondary | ICD-10-CM | POA: Diagnosis not present

## 2022-01-14 DIAGNOSIS — F17200 Nicotine dependence, unspecified, uncomplicated: Secondary | ICD-10-CM | POA: Diagnosis not present

## 2022-01-14 DIAGNOSIS — F32A Depression, unspecified: Secondary | ICD-10-CM

## 2022-01-14 DIAGNOSIS — Z975 Presence of (intrauterine) contraceptive device: Secondary | ICD-10-CM

## 2022-01-14 MED ORDER — NICOTINE POLACRILEX 2 MG MT GUM: 2.0000 mg | CHEWING_GUM | OROMUCOSAL | 0 refills | Status: DC | PRN

## 2022-01-14 MED ORDER — NORGESTIMATE-ETH ESTRADIOL 0.25-35 MG-MCG PO TABS: 1.0000 | ORAL_TABLET | Freq: Every day | ORAL | 0 refills | Status: DC

## 2022-01-14 MED ORDER — NICOTINE 7 MG/24HR TD PT24: 7.0000 mg | MEDICATED_PATCH | Freq: Every day | TRANSDERMAL | 0 refills | Status: DC

## 2022-01-14 MED ORDER — SUBOXONE 8-2 MG SL FILM: ORAL_FILM | SUBLINGUAL | 0 refills | Status: DC

## 2022-01-14 NOTE — Progress Notes (Signed)
GYNECOLOGY VIRTUAL VISIT ENCOUNTER NOTE  Provider location: [facility] [place] [facility] [place] [facility]   Patient location: Home  I connected with Dawn Huff on 01/14/22 at  3:55 PM EDT by MyChart Video Encounter and verified that I am speaking with the correct person using two identifiers.   I discussed the limitations, risks, security and privacy concerns of performing an evaluation and management service virtually and the availability of in person appointments. I also discussed with the patient that there may be a patient responsible charge related to this service. The patient expressed understanding and agreed to proceed.   History:  Dawn Huff is a 26 y.o. [phone] female being evaluated today for multiple issues.  Reports she is feeling much better on Abilify Has definitely noticed a difference  Has been bleeding constantly since Nexplanon insertion on 10/29/2021 Wondering what can be done  Wanting to quit smoking Wondering about NRT      Past Medical History:  Diagnosis Date   ADHD (attention deficit hyperactivity disorder)    Contraceptive management 03/13/2013   IBS (irritable bowel syndrome)    Irregular bleeding 03/10/2013   Lactose intolerance    Low-lying placenta 09/23/2020   1.4 cm from os on initial anatomy scan Resolved 10/18/20   Past Surgical History:  Procedure Laterality Date   CESAREAN SECTION  02/16/2021   Procedure: CESAREAN SECTION;  Surgeon: Donnamae Jude, MD;  [facility]: [facility] LD [facility]S;  Service: Obstetrics;;   NO PAST SURGERIES     The following portions of the patient's history were reviewed and updated as appropriate: allergies, current medications, past family history, past medical history, past social history, past surgical history and problem list.   Health Maintenance:     Component Value Date/Time   DIAGPAP  07/30/2020 1533    - Negative for intraepithelial lesion or malignancy (NILM)   ADEQPAP  07/30/2020 1533    Satisfactory  for evaluation; transformation zone component PRESENT.     Review of Systems:  Pertinent items noted in HPI and remainder of comprehensive ROS otherwise negative.  Physical Exam:   General:  Alert, oriented and cooperative. Patient appears to be in no acute distress.  Mental Status: Normal mood and affect. Normal behavior. Normal judgment and thought content.   Respiratory: Normal respiratory effort, no problems with respiration noted  Rest of physical exam deferred due to type of encounter  Labs and Imaging No results found for this or any previous visit (from the past 336 hour(s)). No results found.     Assessment and Plan:     1. Breakthrough bleeding on Nexplanon Trial OCP's for one month - norgestimate-ethinyl estradiol (ORTHO-CYCLEN) 0.25-35 MG-MCG tablet; Take 1 tablet by mouth daily.  Dispense: 28 tablet; Refill: 0  2. Opioid use disorder Stable  Refill sent UDS at next in person visit in one month - SUBOXONE 8-2 MG FILM; Take 1/2 film every 8 hours  Dispense: 45 each; Refill: 0  3. Tobacco use disorder NRT sent per request - nicotine (NICODERM CQ - DOSED IN MG/24 HR) 7 mg/24hr patch; Place 1 patch (7 mg total) onto the skin daily.  Dispense: 14 patch; Refill: 0 - nicotine polacrilex (NICORETTE) 2 MG gum; Take 1 each (2 mg total) by mouth as needed for smoking cessation.  Dispense: 100 tablet; Refill: 0  4. Anxiety and depression Finally doing better on Abilify 5 mg and Zoloft 200mg        I discussed the assessment and treatment plan with  the patient. The patient was provided an opportunity to ask questions and all were answered. The patient agreed with the plan and demonstrated an understanding of the instructions.   The patient was advised to call back or seek an in-person evaluation/go to the ED if the symptoms worsen or if the condition fails to improve as anticipated.  I provided 15 minutes of face-to-face time during this encounter.   Venora Maples,  MD Center for [facility], [facility]

## 2022-01-14 NOTE — Progress Notes (Deleted)
   GYNECOLOGY OFFICE VISIT NOTE  History:   Dawn Huff is a 26 y.o. [phone] here today for follow up of multiple issues.  ***  Health Maintenance Due  Topic Date Due   COVID-19 Vaccine (1) Never done   HPV VACCINES (1 - 2-dose series) Never done   INFLUENZA VACCINE  Never done    Past Medical History:  Diagnosis Date   ADHD (attention deficit hyperactivity disorder)    Contraceptive management 03/13/2013   IBS (irritable bowel syndrome)    Irregular bleeding 03/10/2013   Lactose intolerance    Low-lying placenta 09/23/2020   1.4 cm from os on initial anatomy scan Resolved 10/18/20    Past Surgical History:  Procedure Laterality Date   CESAREAN SECTION  02/16/2021   Procedure: CESAREAN SECTION;  Surgeon: Donnamae Jude, MD;  Location: [facility];  Service: Obstetrics;;   NO PAST SURGERIES      The following portions of the patient's history were reviewed and updated as appropriate: allergies, current medications, past family history, past medical history, past social history, past surgical history and problem list.   Health Maintenance:   Last pap: Lab Results  Component Value Date   DIAGPAP  07/30/2020    - Negative for intraepithelial lesion or malignancy (NILM)   ***  Last mammogram:  ***  ***reminder message  Review of Systems:  Pertinent items noted in HPI and remainder of comprehensive ROS otherwise negative.  Physical Exam:  LMP  (LMP Unknown)  CONSTITUTIONAL: Well-developed, well-nourished female in no acute distress.  HEENT:  Normocephalic, atraumatic. External right and left ear normal. No scleral icterus.  NECK: Normal range of motion, supple, no masses noted on observation SKIN: No rash noted. Not diaphoretic. No erythema. No pallor. MUSCULOSKELETAL: Normal range of motion. No edema noted. NEUROLOGIC: Alert and oriented to person, place, and time. Normal muscle tone coordination.  PSYCHIATRIC: Normal mood and affect. Normal behavior. Normal judgment  and thought content. RESPIRATORY: Effort normal, no problems with respiration noted ABDOMEN: No masses noted. No other overt distention noted.  *** PELVIC: {Blank single:19197::"Deferred","Normal appearing external genitalia; normal appearing vaginal mucosa and cervix.  No abnormal discharge noted.  Normal uterine size, no other palpable masses, no uterine or adnexal tenderness."}  Labs and Imaging No results found for this or any previous visit (from the past 168 hour(s)). No results found.    Assessment and Plan:   Problem List Items Addressed This Visit   None   Routine preventative health maintenance measures emphasized. Please refer to After Visit Summary for other counseling recommendations.   No follow-ups on file.    Total face-to-face time with patient: {Blank single:19197::"10","15","20","25","30"} minutes.  Over 50% of encounter was spent on counseling and coordination of care.   Clarnce Flock, MD/MPH Attending Family Medicine Physician, [facility] for [facility], [place]

## 2022-02-11 ENCOUNTER — Other Ambulatory Visit: Payer: Self-pay | Admitting: *Deleted

## 2022-02-11 ENCOUNTER — Encounter: Payer: Self-pay | Admitting: *Deleted

## 2022-02-11 ENCOUNTER — Encounter: Payer: Self-pay | Admitting: Family Medicine

## 2022-02-11 DIAGNOSIS — Z975 Presence of (intrauterine) contraceptive device: Secondary | ICD-10-CM

## 2022-02-11 MED ORDER — MEGESTROL ACETATE 20 MG PO TABS: 20.0000 mg | ORAL_TABLET | Freq: Two times a day (BID) | ORAL | 0 refills | Status: DC

## 2022-02-12 ENCOUNTER — Other Ambulatory Visit: Payer: Self-pay | Admitting: Family Medicine

## 2022-02-12 DIAGNOSIS — F1190 Opioid use, unspecified, uncomplicated: Secondary | ICD-10-CM

## 2022-02-13 ENCOUNTER — Other Ambulatory Visit: Payer: Self-pay | Admitting: Family Medicine

## 2022-02-13 ENCOUNTER — Encounter: Payer: Self-pay | Admitting: Family Medicine

## 2022-02-13 DIAGNOSIS — F1190 Opioid use, unspecified, uncomplicated: Secondary | ICD-10-CM

## 2022-02-13 MED ORDER — SUBOXONE 8-2 MG SL FILM: ORAL_FILM | SUBLINGUAL | 0 refills | Status: DC

## 2022-02-17 ENCOUNTER — Encounter: Payer: Self-pay | Admitting: Family Medicine

## 2022-02-17 ENCOUNTER — Other Ambulatory Visit: Payer: Self-pay

## 2022-02-17 ENCOUNTER — Ambulatory Visit (INDEPENDENT_AMBULATORY_CARE_PROVIDER_SITE_OTHER): Payer: Medicaid Other | Admitting: Family Medicine

## 2022-02-17 VITALS — BP 128/73 | HR 93 | Wt 185.0 lb

## 2022-02-17 DIAGNOSIS — F1190 Opioid use, unspecified, uncomplicated: Secondary | ICD-10-CM

## 2022-02-17 DIAGNOSIS — R5383 Other fatigue: Secondary | ICD-10-CM

## 2022-02-17 DIAGNOSIS — F419 Anxiety disorder, unspecified: Secondary | ICD-10-CM

## 2022-02-17 DIAGNOSIS — F32A Depression, unspecified: Secondary | ICD-10-CM | POA: Diagnosis not present

## 2022-02-17 NOTE — Assessment & Plan Note (Signed)
Reassured patient that her CBC and TSH were very normal recently, strongly desires recheck. Collected today.

## 2022-02-17 NOTE — Progress Notes (Signed)
[facility] GYNECOLOGY OFFICE VISIT NOTE  History:   Dawn Huff is a 26 y.o. [phone] here today for follow up.  Reports she is feeling like she is having more cravings with suboxone On 4 mg TID, wanting to go up a bit  Mother wants her to get her thyroid checked She reports she is often tired Also feels cold often  Doing well mood wise after addition of abilify, definitely notices the difference  Health Maintenance Due  Topic Date Due   COVID-19 Vaccine (1) Never done   HPV VACCINES (1 - 2-dose series) Never done   INFLUENZA VACCINE  Never done    Past Medical History:  Diagnosis Date   ADHD (attention deficit hyperactivity disorder)    Contraceptive management 03/13/2013   IBS (irritable bowel syndrome)    Irregular bleeding 03/10/2013   Lactose intolerance    Low-lying placenta 09/23/2020   1.4 cm from os on initial anatomy scan Resolved 10/18/20    Past Surgical History:  Procedure Laterality Date   CESAREAN SECTION  02/16/2021   Procedure: CESAREAN SECTION;  Surgeon: Reva Bores, MD;  Location: [facility];  Service: Obstetrics;;   NO PAST SURGERIES      The following portions of the patient's history were reviewed and updated as appropriate: allergies, current medications, past family history, past medical history, past social history, past surgical history and problem list.   Health Maintenance:   Last pap: Lab Results  Component Value Date   DIAGPAP  07/30/2020    - Negative for intraepithelial lesion or malignancy (NILM)     Last mammogram:  N/a    Review of Systems:  Pertinent items noted in HPI and remainder of comprehensive ROS otherwise negative.  Physical Exam:  BP 128/73   Pulse 93   Wt 185 lb (83.9 kg)   Breastfeeding No   BMI 36.13 kg/m  CONSTITUTIONAL: Well-developed, well-nourished female in no acute distress.  HEENT:  Normocephalic, atraumatic. External right and left ear normal. No scleral icterus.  NECK: Normal  range of motion, supple, no masses noted on observation SKIN: No rash noted. Not diaphoretic. No erythema. No pallor. MUSCULOSKELETAL: Normal range of motion. No edema noted. NEUROLOGIC: Alert and oriented to person, place, and time. Normal muscle tone coordination.  PSYCHIATRIC: Normal mood and affect. Normal behavior. Normal judgment and thought content. RESPIRATORY: Effort normal, no problems with respiration noted   Labs and Imaging No results found for this or any previous visit (from the past 168 hour(s)). No results found.    Assessment and Plan:   Problem List Items Addressed This Visit       Other   Opioid use disorder - Primary    Stable. UDS today. Plan to go up to 8 mg BID with next refill      Relevant Orders   ToxASSURE Select 13 (MW), Urine   Anxiety and depression    Stable on Abilify 5 mg and Zoloft 200 mg QHS. Continue current regimen.       Other fatigue    Reassured patient that her CBC and TSH were very normal recently, strongly desires recheck. Collected today.       Relevant Orders   CBC   TSH + free T4    Routine preventative health maintenance measures emphasized. Please refer to After Visit Summary for other counseling recommendations.   Return in about 3 months (around 05/20/2022) for Dyad patient, OUD f/u, depression follow up.    Total  face-to-face time with patient: 20 minutes.  Over 50% of encounter was spent on counseling and coordination of care.   Venora Maples, MD/MPH Attending Family Medicine Physician, [facility] for [facility], [facility]

## 2022-02-17 NOTE — Assessment & Plan Note (Signed)
Stable. UDS today. Plan to go up to 8 mg BID with next refill

## 2022-02-17 NOTE — Assessment & Plan Note (Signed)
Stable on Abilify 5 mg and Zoloft 200 mg QHS. Continue current regimen.

## 2022-02-18 LAB — CBC
Hematocrit: 42 % (ref 34.0–46.6)
Hemoglobin: 13.6 g/dL (ref 11.1–15.9)
MCH: 29.2 pg (ref 26.6–33.0)
MCHC: 32.4 g/dL (ref 31.5–35.7)
MCV: 90 fL (ref 79–97)
Platelets: 305 10*3/uL (ref 150–450)
RBC: 4.65 x10E6/uL (ref 3.77–5.28)
RDW: 12.5 % (ref 11.7–15.4)
WBC: 11.1 10*3/uL — ABNORMAL HIGH (ref 3.4–10.8)

## 2022-02-18 LAB — TSH+FREE T4
Free T4: 1.3 ng/dL (ref 0.82–1.77)
TSH: 0.852 u[IU]/mL (ref 0.450–4.500)

## 2022-02-21 LAB — TOXASSURE SELECT 13 (MW), URINE

## 2022-03-12 ENCOUNTER — Other Ambulatory Visit: Payer: Self-pay | Admitting: Family Medicine

## 2022-03-12 DIAGNOSIS — F1190 Opioid use, unspecified, uncomplicated: Secondary | ICD-10-CM

## 2022-03-12 MED ORDER — SUBOXONE 8-2 MG SL FILM: ORAL_FILM | SUBLINGUAL | 0 refills | Status: DC

## 2022-04-15 ENCOUNTER — Other Ambulatory Visit: Payer: Self-pay | Admitting: Family Medicine

## 2022-04-15 DIAGNOSIS — F1190 Opioid use, unspecified, uncomplicated: Secondary | ICD-10-CM

## 2022-04-15 MED ORDER — SUBOXONE 8-2 MG SL FILM: ORAL_FILM | SUBLINGUAL | 0 refills | Status: DC

## 2022-05-19 ENCOUNTER — Other Ambulatory Visit: Payer: Self-pay | Admitting: Family Medicine

## 2022-05-19 DIAGNOSIS — F1190 Opioid use, unspecified, uncomplicated: Secondary | ICD-10-CM

## 2022-05-20 MED ORDER — SUBOXONE 8-2 MG SL FILM: ORAL_FILM | SUBLINGUAL | 0 refills | Status: DC

## 2022-06-01 ENCOUNTER — Encounter: Payer: Self-pay | Admitting: Family Medicine

## 2022-06-03 ENCOUNTER — Ambulatory Visit
Admission: EM | Admit: 2022-06-03 | Discharge: 2022-06-03 | Disposition: A | Discharge status: Home / Self Care | Payer: Medicaid Other | Attending: Nurse Practitioner | Admitting: Nurse Practitioner

## 2022-06-03 ENCOUNTER — Encounter: Payer: Self-pay | Admitting: Emergency Medicine

## 2022-06-03 DIAGNOSIS — L03112 Cellulitis of left axilla: Secondary | ICD-10-CM | POA: Diagnosis not present

## 2022-06-03 MED ORDER — DOXYCYCLINE HYCLATE 100 MG PO TABS: 100.0000 mg | ORAL_TABLET | Freq: Two times a day (BID) | ORAL | 0 refills | Status: AC

## 2022-06-03 NOTE — Discharge Instructions (Addendum)
Take medication as prescribed. Warm compresses to the affected area 3-4 times daily. Clean the area at least twice daily with Dial Gold bar soap or another type of antibacterial soap. Keep the area covered if it begins to drain. Follow-up in this clinic if you continue to experience pain is, increased swelling, or the area begins to look like a pimple. Go to the emergency department if you develop fever, chills, generalized fatigue, nausea, vomiting, or if the area of redness spreads into the armpit, or if you develop foul-smelling drainage from the site. Follow-up as needed.

## 2022-06-03 NOTE — ED Provider Notes (Signed)
[facility]    CSN: [id number] Arrival date & time: 06/03/22  1025      History   Chief Complaint No chief complaint on file.   HPI Dawn Huff is a 27 y.o. female.   The history is provided by the patient.   The patient presents for complaints of pain and swelling under the left armpit.  Patient states symptoms started over the past week.  She states the area has become larger in size, and more painful.  She denies fever, chills, chest pain, abdominal pain, nausea, vomiting, or diarrhea.  Patient reports that she has been using warm compresses and fat back meat along with mupirocin ointment.  Patient also states that her mother had to clindamycin from the remaining prescription that she took.  Past Medical History:  Diagnosis Date   ADHD (attention deficit hyperactivity disorder)    Contraceptive management 03/13/2013   IBS (irritable bowel syndrome)    Irregular bleeding 03/10/2013   Lactose intolerance    Low-lying placenta 09/23/2020   1.4 cm from os on initial anatomy scan Resolved 10/18/20    Patient Active Problem List   Diagnosis Date Noted   Other fatigue 02/17/2022   Drug-induced constipation 10/30/2021   Anxiety and depression 06/03/2021   Syncope 11/26/2020   Rubella non-immune status, antepartum 08/13/2020   Opioid use disorder 07/30/2020   Nexplanon in place 03/13/2013    Past Surgical History:  Procedure Laterality Date   CESAREAN SECTION  02/16/2021   Procedure: CESAREAN SECTION;  Surgeon: Donnamae Jude, MD;  Location: [facility];  Service: Obstetrics;;   NO PAST SURGERIES      OB History     Gravida  4   Para  1   Term  1   Preterm  0   AB  3   Living  1      SAB  3   IAB  0   Ectopic  0   Multiple  0   Live Births  1            Home Medications    Prior to Admission medications   Medication Sig Start Date End Date Taking? Authorizing Provider  doxycycline (VIBRA-TABS) 100 MG tablet Take 1 tablet (100 mg  total) by mouth 2 (two) times daily for 5 days. 06/03/22 06/08/22 Yes [place] Mckeough-Warren, Alda Lea, NP  ARIPiprazole (ABILIFY) 5 MG tablet Take 1 tablet (5 mg total) by mouth daily. 12/17/21   Clarnce Flock, MD  megestrol (MEGACE) 20 MG tablet Take 1 tablet (20 mg total) by mouth 2 (two) times daily. 02/11/22   Clarnce Flock, MD  nicotine (NICODERM CQ - DOSED IN MG/24 HR) 7 mg/24hr patch Place 1 patch (7 mg total) onto the skin daily. 01/14/22   Clarnce Flock, MD  nicotine polacrilex (NICORETTE) 2 MG gum Take 1 each (2 mg total) by mouth as needed for smoking cessation. 01/14/22   Clarnce Flock, MD  polyethylene glycol powder (GLYCOLAX/MIRALAX) 17 GM/SCOOP powder Take 17 g by mouth daily as needed. 10/29/21   Clarnce Flock, MD  Prenatal Vit-Fe Fumarate-FA (M-NATAL PLUS) 27-1 MG TABS Take 1 tablet by mouth daily. 11/26/20   Clarnce Flock, MD  sertraline (ZOLOFT) 100 MG tablet Take 2 tablets (200 mg total) by mouth daily. 10/29/21 01/27/22  Clarnce Flock, MD  SUBOXONE 8-2 MG FILM Take 1/2 film every 8 hours 05/20/22   Clarnce Flock, MD    Family History  Family History  Problem Relation Age of Onset   Hypertension Maternal Grandmother    Diabetes Maternal Grandmother     Social History Social History   Tobacco Use   Smoking status: Every Day    Packs/day: 0.50    Years: 6.00    Total pack years: 3.00    Types: Cigarettes   Smokeless tobacco: Never  Vaping Use   Vaping Use: Every day   Substances: Nicotine-salt  Substance Use Topics   Alcohol use: Not Currently    Comment: occ   Drug use: Not Currently    Types: Marijuana, Heroin, Methamphetamines    Comment: last used 8.5 months ago     Allergies   Lactose intolerance (gi)   Review of Systems Review of Systems Per HPI  Physical Exam Triage Vital Signs ED Triage Vitals  Enc Vitals Group     BP 06/03/22 1057 102/70     Pulse Rate 06/03/22 1057 83     Resp 06/03/22 1057 18     Temp 06/03/22  1057 98.6 F (37 C)     Temp Source 06/03/22 1057 Oral     SpO2 06/03/22 1057 93 %     Weight --      Height --      Head Circumference --      Peak Flow --      Pain Score 06/03/22 1058 5     Pain Loc --      Pain Edu? --      Excl. in [place]? --    No data found.  Updated Vital Signs BP 102/70 (BP Location: Right Arm)   Pulse 83   Temp 98.6 F (37 C) (Oral)   Resp 18   SpO2 93%   Visual Acuity Right Eye Distance:   Left Eye Distance:   Bilateral Distance:    Right Eye Near:   Left Eye Near:    Bilateral Near:     Physical Exam Vitals and nursing note reviewed.  Constitutional:      General: She is not in acute distress.    Appearance: Normal appearance.  HENT:     Head: Normocephalic.  Eyes:     Extraocular Movements: Extraocular movements intact.     Pupils: Pupils are equal, round, and reactive to light.  Pulmonary:     Effort: Pulmonary effort is normal.  Abdominal:     General: Bowel sounds are normal.     Palpations: Abdomen is soft.  Musculoskeletal:     Cervical back: Normal range of motion.  Skin:    General: Skin is warm and dry.     Comments: Erythematous induration noted under the left axilla.  Area measures approximately 4 cm in length and 2 cm in diameter.  Area is firm.  There is no oozing, fluctuance, or drainage present.  Neurological:     General: No focal deficit present.     Mental Status: She is alert and oriented to person, place, and time.  Psychiatric:        Mood and Affect: Mood normal.        Behavior: Behavior normal.      UC Treatments / Results  Labs (all labs ordered are listed, but only abnormal results are displayed) Labs Reviewed - No data to display  EKG   Radiology No results found.  Procedures Procedures (including critical care time)  Medications Ordered in UC Medications - No data to display  Initial Impression / Assessment and Plan /  UC Course  I have reviewed the triage vital signs and the nursing  notes.  Pertinent labs & imaging results that were available during my care of the patient were reviewed by me and considered in my medical decision making (see chart for details).  The patient is well-appearing, she is in no acute distress, vital signs are stable.  Symptoms appear to be consistent with cellulitis of the left axilla.  Will treat patient with doxycycline 100 mg twice daily for the next 5 days.  Supportive care recommendations were provided to the patient to include warm compresses to the area, and cleansing the area with an antibacterial soap.  Patient was given strict ER follow-up precautions.  Patient is in agreement with this plan of care and verbalizes understanding.  All questions were answered.  Patient stable for discharge.   Final Clinical Impressions(s) / UC Diagnoses   Final diagnoses:  Cellulitis of axilla, left     Discharge Instructions      Take medication as prescribed. Warm compresses to the affected area 3-4 times daily. Clean the area at least twice daily with Dial Gold bar soap or another type of antibacterial soap. Keep the area covered if it begins to drain. Follow-up in this clinic if you continue to experience pain is, increased swelling, or the area begins to look like a pimple. Go to the emergency department if you develop fever, chills, generalized fatigue, nausea, vomiting, or if the area of redness spreads into the armpit, or if you develop foul-smelling drainage from the site. Follow-up as needed.     ED Prescriptions     Medication Sig Dispense Auth. Provider   doxycycline (VIBRA-TABS) 100 MG tablet Take 1 tablet (100 mg total) by mouth 2 (two) times daily for 5 days. 10 tablet [place]xton Noreen-Warren, Alda Lea, NP      PDMP not reviewed this encounter.   Tish Men, NP 06/03/22 [phone]

## 2022-06-03 NOTE — ED Triage Notes (Signed)
Abscess under left arm pit x 1 week.  Has been using warm compresses and fat back and has tried mupirocin ointment

## 2022-06-15 ENCOUNTER — Other Ambulatory Visit: Payer: Self-pay | Admitting: Family Medicine

## 2022-06-15 DIAGNOSIS — F1190 Opioid use, unspecified, uncomplicated: Secondary | ICD-10-CM

## 2022-06-16 MED ORDER — SUBOXONE 8-2 MG SL FILM: ORAL_FILM | SUBLINGUAL | 0 refills | Status: DC

## 2022-06-30 ENCOUNTER — Other Ambulatory Visit: Payer: Self-pay | Admitting: Family Medicine

## 2022-06-30 DIAGNOSIS — F1190 Opioid use, unspecified, uncomplicated: Secondary | ICD-10-CM

## 2022-06-30 MED ORDER — SUBOXONE 8-2 MG SL FILM: ORAL_FILM | SUBLINGUAL | 0 refills | Status: DC

## 2022-06-30 NOTE — Progress Notes (Signed)
Discussed dose increase at daughter's [facility], will go up by 1/2 film in the evening as that is when she is feeling worse

## 2022-08-20 ENCOUNTER — Encounter: Payer: Self-pay | Admitting: Family Medicine

## 2022-08-20 DIAGNOSIS — F1190 Opioid use, unspecified, uncomplicated: Secondary | ICD-10-CM

## 2022-08-21 MED ORDER — SUBOXONE 8-2 MG SL FILM: ORAL_FILM | SUBLINGUAL | 0 refills | Status: DC

## 2022-09-23 ENCOUNTER — Other Ambulatory Visit: Payer: Self-pay | Admitting: Family Medicine

## 2022-09-23 DIAGNOSIS — F1190 Opioid use, unspecified, uncomplicated: Secondary | ICD-10-CM

## 2022-09-24 MED ORDER — SUBOXONE 8-2 MG SL FILM: ORAL_FILM | SUBLINGUAL | 0 refills | Status: DC

## 2022-09-25 ENCOUNTER — Other Ambulatory Visit: Payer: Self-pay | Admitting: Family Medicine

## 2022-09-25 ENCOUNTER — Encounter: Payer: Self-pay | Admitting: Family Medicine

## 2022-09-25 DIAGNOSIS — F419 Anxiety disorder, unspecified: Secondary | ICD-10-CM

## 2022-09-25 MED ORDER — ARIPIPRAZOLE 5 MG PO TABS: 5.0000 mg | ORAL_TABLET | Freq: Every day | ORAL | 5 refills | Status: DC

## 2022-09-25 NOTE — Telephone Encounter (Signed)
[facility] in [place] called stating this patient needs refills for ABILIFY, could a nurse call patient to have these refills

## 2022-10-24 ENCOUNTER — Other Ambulatory Visit: Payer: Self-pay | Admitting: Family Medicine

## 2022-10-24 DIAGNOSIS — F1190 Opioid use, unspecified, uncomplicated: Secondary | ICD-10-CM

## 2022-10-26 ENCOUNTER — Encounter: Payer: Self-pay | Admitting: Family Medicine

## 2022-10-26 MED ORDER — SUBOXONE 8-2 MG SL FILM: ORAL_FILM | SUBLINGUAL | 0 refills | Status: DC

## 2022-11-08 ENCOUNTER — Other Ambulatory Visit: Payer: Self-pay | Admitting: Family Medicine

## 2022-11-08 DIAGNOSIS — F419 Anxiety disorder, unspecified: Secondary | ICD-10-CM

## 2022-11-09 MED ORDER — SERTRALINE HCL 100 MG PO TABS: 200.0000 mg | ORAL_TABLET | Freq: Every day | ORAL | 3 refills | Status: DC

## 2022-11-27 ENCOUNTER — Other Ambulatory Visit: Payer: Self-pay

## 2022-11-27 ENCOUNTER — Encounter: Payer: Self-pay | Admitting: Family Medicine

## 2022-11-27 ENCOUNTER — Ambulatory Visit (INDEPENDENT_AMBULATORY_CARE_PROVIDER_SITE_OTHER): Payer: MEDICAID | Admitting: Family Medicine

## 2022-11-27 VITALS — BP 117/82 | HR 82 | Ht 60.0 in | Wt 209.4 lb

## 2022-11-27 DIAGNOSIS — F32A Depression, unspecified: Secondary | ICD-10-CM | POA: Diagnosis not present

## 2022-11-27 DIAGNOSIS — F1190 Opioid use, unspecified, uncomplicated: Secondary | ICD-10-CM

## 2022-11-27 DIAGNOSIS — F419 Anxiety disorder, unspecified: Secondary | ICD-10-CM | POA: Diagnosis not present

## 2022-11-27 MED ORDER — BUPRENORPHINE HCL-NALOXONE HCL 8-2 MG SL SUBL
1.0000 | SUBLINGUAL_TABLET | Freq: Two times a day (BID) | SUBLINGUAL | 2 refills | Status: AC
Start: 2022-11-27 — End: 2022-12-27

## 2022-11-27 NOTE — Assessment & Plan Note (Signed)
Struggling with anxiety but overall massively improved on abilify 5 mg and zoloft 200 mg. Will reach out to colleague to see about referral to a psychiatrist to explore her anxiety a bit more.

## 2022-11-27 NOTE — Assessment & Plan Note (Signed)
Refill sent UDS today

## 2022-11-27 NOTE — Progress Notes (Signed)
GYNECOLOGY OFFICE VISIT NOTE  History:   Dawn Huff is a 27 y.o. [phone] here today for follow up of multiple issues.  Reports dose of suboxone is good She would like to switch from films to tablets to see if they are more palatable Will keep daily dose the same but change to 8 mg BID  She is very concerned about her weight, which has increased significantly since delivery of Dawn Huff She is interested in weight loss medications  She has ongoing issues with her anxiety Depression is in a good place but still struggling with anxiety  Health Maintenance Due  Topic Date Due   COVID-19 Vaccine (1 - 2023-24 season) Never done   INFLUENZA VACCINE  10/29/2022    Past Medical History:  Diagnosis Date   ADHD (attention deficit hyperactivity disorder)    Contraceptive management 03/13/2013   IBS (irritable bowel syndrome)    Irregular bleeding 03/10/2013   Lactose intolerance    Low-lying placenta 09/23/2020   1.4 cm from os on initial anatomy scan Resolved 10/18/20    Past Surgical History:  Procedure Laterality Date   CESAREAN SECTION  02/16/2021   Procedure: CESAREAN SECTION;  Surgeon: Reva Bores, MD;  Location: [facility];  Service: Obstetrics;;   NO PAST SURGERIES      The following portions of the patient's history were reviewed and updated as appropriate: allergies, current medications, past family history, past medical history, past social history, past surgical history and problem list.   Health Maintenance:   Last pap: Lab Results  Component Value Date   DIAGPAP  07/30/2020    - Negative for intraepithelial lesion or malignancy (NILM)    Last mammogram:  N/a    Review of Systems:  Pertinent items noted in HPI and remainder of comprehensive ROS otherwise negative.  Physical Exam:  BP 117/82   Pulse 82   Ht 5' (1.524 m)   Wt 209 lb 6.4 oz (95 kg)   LMP  (LMP Unknown)   Breastfeeding No   BMI 40.90 kg/m  CONSTITUTIONAL: Well-developed, well-nourished  female in no acute distress.  HEENT:  Normocephalic, atraumatic. External right and left ear normal. No scleral icterus.  NECK: Normal range of motion, supple, no masses noted on observation SKIN: No rash noted. Not diaphoretic. No erythema. No pallor. MUSCULOSKELETAL: Normal range of motion. No edema noted. NEUROLOGIC: Alert and oriented to person, place, and time. Normal muscle tone coordination.  PSYCHIATRIC: Normal mood and affect. Normal behavior. Normal judgment and thought content. RESPIRATORY: Effort normal, no problems with respiration noted  Labs and Imaging No results found for this or any previous visit (from the past 168 hour(s)). No results found.    Assessment and Plan:   Problem List Items Addressed This Visit       Other   Anxiety and depression    Struggling with anxiety but overall massively improved on abilify 5 mg and zoloft 200 mg. Will reach out to colleague to see about referral to a psychiatrist to explore her anxiety a bit more.       Morbid obesity (HCC)    Referred to healthy weight clinic.       Relevant Orders   Amb ref to Medical Nutrition Therapy-MNT   Opioid use disorder - Primary    Refill sent UDS today      Relevant Medications   buprenorphine-naloxone (SUBOXONE) 8-2 mg SUBL SL tablet   Other Relevant Orders   ToxAssure Flex 15, Ur  Routine preventative health maintenance measures emphasized. Please refer to After Visit Summary for other counseling recommendations.   Return in about 3 months (around 02/27/2023) for Dyad patient, OUD f/u.    Total face-to-face time with patient: 20 minutes.  Over 50% of encounter was spent on counseling and coordination of care.   Venora Maples, MD/MPH Attending Family Medicine Physician, [facility] for [facility], [facility]

## 2022-11-27 NOTE — Assessment & Plan Note (Signed)
Referred to healthy weight clinic.

## 2022-12-03 LAB — TOXASSURE FLEX 15, UR
6-ACETYLMORPHINE IA: NEGATIVE ng/mL
7-aminoclonazepam: NOT DETECTED ng/mg{creat}
AMPHETAMINES IA: NEGATIVE ng/mL
Alpha-hydroxyalprazolam: NOT DETECTED ng/mg{creat}
Alpha-hydroxymidazolam: NOT DETECTED ng/mg{creat}
Alpha-hydroxytriazolam: NOT DETECTED ng/mg{creat}
Alprazolam: NOT DETECTED ng/mg{creat}
BARBITURATES IA: NEGATIVE ng/mL
BUPRENORPHINE: POSITIVE
Benzodiazepines: NEGATIVE
Buprenorphine: 276 ng/mg{creat}
COCAINE METABOLITE IA: NEGATIVE ng/mL
Clonazepam: NOT DETECTED ng/mg{creat}
Creatinine: 274 mg/dL
Desalkylflurazepam: NOT DETECTED ng/mg{creat}
Desmethyldiazepam: NOT DETECTED ng/mg{creat}
Desmethylflunitrazepam: NOT DETECTED ng/mg{creat}
Diazepam: NOT DETECTED ng/mg{creat}
ETHYL ALCOHOL Enzymatic: NEGATIVE g/dL
FENTANYL: NEGATIVE
Fentanyl: NOT DETECTED ng/mg{creat}
Flunitrazepam: NOT DETECTED ng/mg{creat}
Lorazepam: NOT DETECTED ng/mg{creat}
METHADONE IA: NEGATIVE ng/mL
METHADONE MTB IA: NEGATIVE ng/mL
Midazolam: NOT DETECTED ng/mg{creat}
Norbuprenorphine: >365 ng/mg{creat}
Norfentanyl: NOT DETECTED ng/mg{creat}
OPIATE CLASS IA: NEGATIVE ng/mL
OXYCODONE CLASS IA: NEGATIVE ng/mL
Oxazepam: NOT DETECTED ng/mg{creat}
PHENCYCLIDINE IA: NEGATIVE ng/mL
TAPENTADOL, IA: NEGATIVE ng/mL
TRAMADOL IA: NEGATIVE ng/mL
Temazepam: NOT DETECTED ng/mg{creat}

## 2022-12-03 LAB — CANNABINOIDS, MS, UR RFX
Cannabinoids Confirmation: POSITIVE
Carboxy-THC: >365 ng/mg{creat}

## 2023-01-05 ENCOUNTER — Other Ambulatory Visit (HOSPITAL_COMMUNITY): Payer: Self-pay

## 2023-01-28 ENCOUNTER — Ambulatory Visit: Payer: MEDICAID | Admitting: Nutrition

## 2023-01-29 ENCOUNTER — Other Ambulatory Visit: Payer: Self-pay | Admitting: Family Medicine

## 2023-01-29 DIAGNOSIS — F1190 Opioid use, unspecified, uncomplicated: Secondary | ICD-10-CM

## 2023-03-02 ENCOUNTER — Encounter: Payer: Self-pay | Admitting: Family Medicine

## 2023-03-02 DIAGNOSIS — F1190 Opioid use, unspecified, uncomplicated: Secondary | ICD-10-CM

## 2023-03-02 MED ORDER — BUPRENORPHINE HCL-NALOXONE HCL 8-2 MG SL FILM
1.0000 | ORAL_FILM | Freq: Two times a day (BID) | SUBLINGUAL | 0 refills | Status: DC
Start: 2023-03-02 — End: 2023-04-05

## 2023-03-22 ENCOUNTER — Ambulatory Visit: Payer: MEDICAID | Admitting: Nutrition

## 2023-04-05 ENCOUNTER — Encounter: Payer: Self-pay | Admitting: Family Medicine

## 2023-04-05 ENCOUNTER — Other Ambulatory Visit: Payer: Self-pay | Admitting: Family Medicine

## 2023-04-05 DIAGNOSIS — F1190 Opioid use, unspecified, uncomplicated: Secondary | ICD-10-CM

## 2023-04-05 MED ORDER — BUPRENORPHINE HCL-NALOXONE HCL 8-2 MG SL SUBL: 1.0000 | SUBLINGUAL_TABLET | Freq: Two times a day (BID) | SUBLINGUAL | 0 refills | Status: DC

## 2023-04-17 ENCOUNTER — Telehealth: Payer: MEDICAID | Admitting: Physician Assistant

## 2023-04-17 DIAGNOSIS — K047 Periapical abscess without sinus: Secondary | ICD-10-CM

## 2023-04-17 MED ORDER — IBUPROFEN 600 MG PO TABS: 600.0000 mg | ORAL_TABLET | Freq: Three times a day (TID) | ORAL | 0 refills | Status: DC | PRN

## 2023-04-17 MED ORDER — PENICILLIN V POTASSIUM 500 MG PO TABS
500.0000 mg | ORAL_TABLET | Freq: Three times a day (TID) | ORAL | 0 refills | Status: AC
Start: 2023-04-17 — End: 2023-04-24

## 2023-04-17 NOTE — Patient Instructions (Signed)
Dawn Huff, thank you for joining [name], PA-C for today's virtual visit.  While this provider is not your primary care provider (PCP), if your PCP is located in our provider database this encounter information will be shared with them immediately following your visit.   A [facility] MyChart account gives you access to today's visit and all your visits, tests, and labs performed at [facility] " click here if you don't have a [facility] MyChart account or go to [email or web address]  Consent: (Patient) Dawn Huff provided verbal consent for this virtual visit at the beginning of the encounter.  Current Medications:  Current Outpatient Medications:    ibuprofen (ADVIL) 600 MG tablet, Take 1 tablet (600 mg total) by mouth every 8 (eight) hours as needed., Disp: 30 tablet, Rfl: 0   penicillin v potassium (VEETID) 500 MG tablet, Take 1 tablet (500 mg total) by mouth 3 (three) times daily for 7 days., Disp: 21 tablet, Rfl: 0   ARIPiprazole (ABILIFY) 5 MG tablet, Take 1 tablet (5 mg total) by mouth daily., Disp: 30 tablet, Rfl: 5   buprenorphine-naloxone (SUBOXONE) 8-2 mg SUBL SL tablet, Place 1 tablet under the tongue in the morning and at bedtime., Disp: 60 tablet, Rfl: 0   megestrol (MEGACE) 20 MG tablet, Take 1 tablet (20 mg total) by mouth 2 (two) times daily., Disp: 20 tablet, Rfl: 0   nicotine (NICODERM CQ - DOSED IN MG/24 HR) 7 mg/24hr patch, Place 1 patch (7 mg total) onto the skin daily., Disp: 14 patch, Rfl: 0   nicotine polacrilex (NICORETTE) 2 MG gum, Take 1 each (2 mg total) by mouth as needed for smoking cessation., Disp: 100 tablet, Rfl: 0   polyethylene glycol powder (GLYCOLAX/MIRALAX) 17 GM/SCOOP powder, Take 17 g by mouth daily as needed., Disp: 510 g, Rfl: 1   Prenatal Vit-Fe Fumarate-FA (M-NATAL PLUS) 27-1 MG TABS, Take 1 tablet by mouth daily., Disp: 30 tablet, Rfl: 11   sertraline (ZOLOFT) 100 MG tablet, Take 2 tablets (200 mg total) by mouth daily.,  Disp: 180 tablet, Rfl: 3   Medications ordered in this encounter:  Meds ordered this encounter  Medications   penicillin v potassium (VEETID) 500 MG tablet    Sig: Take 1 tablet (500 mg total) by mouth 3 (three) times daily for 7 days.    Dispense:  21 tablet    Refill:  0    Supervising Provider:   Merrilee Jansky [[id number]]   ibuprofen (ADVIL) 600 MG tablet    Sig: Take 1 tablet (600 mg total) by mouth every 8 (eight) hours as needed.    Dispense:  30 tablet    Refill:  0    Supervising Provider:   Merrilee Jansky [[id number]]     *If you need refills on other medications prior to your next appointment, please contact your pharmacy*  Follow-Up: Call back or seek an in-person evaluation if the symptoms worsen or if the condition fails to improve as anticipated.  [facility] [phone]  Other Instructions Dental Abscess  A dental abscess is an infection around a tooth that may involve pain, swelling, and a collection of pus, as well as other symptoms. Treatment is important to help with symptoms and to prevent the infection from spreading. The general types of dental abscesses are: Pulpal abscess. This abscess may form from the inner part of the tooth (pulp). Periodontal abscess. This abscess may form from the gum. What are the causes? This  condition is caused by a bacterial infection in or around the tooth. It may result from: Severe tooth decay (cavities). Trauma to the tooth, such as a broken or chipped tooth. What increases the risk? This condition is more likely to develop in males. It is also more likely to develop in people who: Have cavities. Have severe gum disease. Eat sugary snacks between meals. Use tobacco products. Have diabetes. Have a weakened disease-fighting system (immune system). Do not brush and care for their teeth regularly. What are the signs or symptoms? Mild symptoms of this condition include: Tenderness. Bad breath. Fever. A  bitter taste in the mouth. Pain in and around the infected tooth. Moderate symptoms of this condition include: Swollen neck glands. Chills. Pus drainage. Swelling and redness around the infected tooth, in the mouth, or in the face. Severe pain in and around the infected tooth. Severe symptoms of this condition include: Difficulty swallowing. Difficulty opening the mouth. Nausea. Vomiting. How is this diagnosed? This condition is diagnosed based on: Your symptoms and your medical and dental history. An examination of the infected tooth. During the exam, your dental care provider may tap on the infected tooth. You may also need to have X-rays taken of the affected area. How is this treated? This condition is treated by getting rid of the infection. This may be done with: Antibiotic medicines. These may be used in certain situations. Antibacterial mouth rinse. Incision and drainage. This procedure is done by making an incision in the abscess to drain out the pus. Removing pus is the first priority in treating an abscess. A root canal. This may be performed to save the tooth. Your dental care provider accesses the visible part of your tooth (crown) with a drill and removes any infected pulp. Then the space is filled and sealed off. Tooth extraction. The tooth is pulled out if it cannot be saved by other treatment. You may also receive treatment for pain, such as: Acetaminophen or NSAIDs. Gels that contain a numbing medicine. An injection to block the pain near your nerve. Follow these instructions at home: Medicines Take over-the-counter and prescription medicines only as told by your dental care provider. If you were prescribed an antibiotic, take it as told by your dental care provider. Do not stop taking the antibiotic even if you start to feel better. If you were prescribed a gel that contains a numbing medicine, use it exactly as told in the directions. Do not use these gels for  children who are younger than 15 years of age. Use an antibacterial mouth rinse as told by your dental care provider. General instructions  Gargle with a mixture of salt and water 3-4 times a day or as needed. To make salt water, completely dissolve -1 tsp (3-6 g) of salt in 1 cup (237 mL) of warm water. Eat a soft diet while your abscess is healing. Drink enough fluid to keep your urine pale yellow. Do not apply heat to the outside of your mouth. Do not use any products that contain nicotine or tobacco. These products include cigarettes, chewing tobacco, and vaping devices, such as e-cigarettes. If you need help quitting, ask your dental care provider. Keep all follow-up visits. This is important. How is this prevented?  Excellent dental home care, which includes brushing your teeth every morning and night with fluoride toothpaste. Floss one time each day. Get regularly scheduled dental cleanings. Consider having a dental sealant applied on teeth that have deep grooves to prevent cavities. Drink  fluoridated water regularly. This includes most tap water. Check the label on bottled water to see if it contains fluoride. Reduce or eliminate sugary drinks. Eat healthy meals and snacks. Wear a mouth guard or face shield to protect your teeth while playing sports. Contact a health care provider if: Your pain is worse and is not helped by medicine. You have swelling. You see pus around the tooth. You have a fever or chills. Get help right away if: Your symptoms suddenly get worse. You have a very bad headache. You have problems breathing or swallowing. You have trouble opening your mouth. You have swelling in your neck or around your eye. These symptoms may represent a serious problem that is an emergency. Do not wait to see if the symptoms will go away. Get medical help right away. Call your local emergency services (911 in the [place] Do not drive yourself to the hospital. Summary A dental  abscess is a collection of pus in or around a tooth that results from an infection. A dental abscess may result from severe tooth decay, trauma to the tooth, or severe gum disease around a tooth. Symptoms include severe pain, swelling, redness, and drainage of pus in and around the infected tooth. The first priority in treating a dental abscess is to drain out the pus. Treatment may also involve removing damage inside the tooth (root canal) or extracting the tooth. This information is not intended to replace advice given to you by your health care provider. Make sure you discuss any questions you have with your health care provider. Document Revised: 05/23/2020 Document Reviewed: 05/23/2020 Elsevier Patient Education  2024 Elsevier Inc.   If you have been instructed to have an in-person evaluation today at a local Urgent Care facility, please use the link below. It will take you to a list of all of our available [facility], including address, phone number and hours of operation. Please do not delay care.  [facility]  If you or a family member do not have a primary care provider, use the link below to schedule a visit and establish care. When you choose a [facility] primary care physician or advanced practice provider, you gain a long-term partner in health. Find a Primary Care Provider  Learn more about [facility] in-office and virtual care options: [facility] - Get Care Now

## 2023-04-17 NOTE — Progress Notes (Signed)
Virtual Visit Consent   Dawn Huff, you are scheduled for a virtual visit with a [facility] provider today. Just as with appointments in the office, your consent must be obtained to participate. Your consent will be active for this visit and any virtual visit you may have with one of our providers in the next 365 days. If you have a MyChart account, a copy of this consent can be sent to you electronically.  As this is a virtual visit, video technology does not allow for your provider to perform a traditional examination. This may limit your provider's ability to fully assess your condition. If your provider identifies any concerns that need to be evaluated in person or the need to arrange testing (such as labs, EKG, etc.), we will make arrangements to do so. Although advances in technology are sophisticated, we cannot ensure that it will always work on either your end or our end. If the connection with a video visit is poor, the visit may have to be switched to a telephone visit. With either a video or telephone visit, we are not always able to ensure that we have a secure connection.  By engaging in this virtual visit, you consent to the provision of healthcare and authorize for your insurance to be billed (if applicable) for the services provided during this visit. Depending on your insurance coverage, you may receive a charge related to this service.  I need to obtain your verbal consent now. Are you willing to proceed with your visit today? Dawn Huff has provided verbal consent on 04/17/2023 for a virtual visit (video or telephone). [name], PA-C  Date: 04/17/2023 5:14 PM  Virtual Visit via Video Note   I, Dawn Huff, connected with  Dawn Huff  ([id number], Oct 22, 1995) on 04/17/23 at  5:00 PM EST by a video-enabled telemedicine application and verified that I am speaking with the correct person using two identifiers.  Location: Patient: Virtual Visit Location Patient:  Home Provider: Virtual Visit Location Provider: Home Office   I discussed the limitations of evaluation and management by telemedicine and the availability of in person appointments. The patient expressed understanding and agreed to proceed.    History of Present Illness: Dawn Huff is a 28 y.o. who identifies as a female who was assigned female at birth, presented with a chief complaint of a facial abscess. They noticed a swollen, tender knot on the lower left side of their face, which appeared overnight. The swelling was associated with pain localized to the lower front gums. They had not attempted any interventions to alleviate the symptoms at the time of the consultation. The patient has a history of dental care, although they have not seen their dentist in a while.     Problems:  Patient Active Problem List   Diagnosis Date Noted   Morbid obesity (HCC) 11/27/2022   Other fatigue 02/17/2022   Drug-induced constipation 10/30/2021   Anxiety and depression 06/03/2021   Syncope 11/26/2020   Rubella non-immune status, antepartum 08/13/2020   Opioid use disorder 07/30/2020   Nexplanon in place 03/13/2013    Allergies:  Allergies  Allergen Reactions   Lactose Intolerance (Gi)    Medications:  Current Outpatient Medications:    ibuprofen (ADVIL) 600 MG tablet, Take 1 tablet (600 mg total) by mouth every 8 (eight) hours as needed., Disp: 30 tablet, Rfl: 0   penicillin v potassium (VEETID) 500 MG tablet, Take 1 tablet (500 mg total) by mouth 3 (three) times daily  for 7 days., Disp: 21 tablet, Rfl: 0   ARIPiprazole (ABILIFY) 5 MG tablet, Take 1 tablet (5 mg total) by mouth daily., Disp: 30 tablet, Rfl: 5   buprenorphine-naloxone (SUBOXONE) 8-2 mg SUBL SL tablet, Place 1 tablet under the tongue in the morning and at bedtime., Disp: 60 tablet, Rfl: 0   megestrol (MEGACE) 20 MG tablet, Take 1 tablet (20 mg total) by mouth 2 (two) times daily., Disp: 20 tablet, Rfl: 0   nicotine (NICODERM CQ  - DOSED IN MG/24 HR) 7 mg/24hr patch, Place 1 patch (7 mg total) onto the skin daily., Disp: 14 patch, Rfl: 0   nicotine polacrilex (NICORETTE) 2 MG gum, Take 1 each (2 mg total) by mouth as needed for smoking cessation., Disp: 100 tablet, Rfl: 0   polyethylene glycol powder (GLYCOLAX/MIRALAX) 17 GM/SCOOP powder, Take 17 g by mouth daily as needed., Disp: 510 g, Rfl: 1   Prenatal Vit-Fe Fumarate-FA (M-NATAL PLUS) 27-1 MG TABS, Take 1 tablet by mouth daily., Disp: 30 tablet, Rfl: 11   sertraline (ZOLOFT) 100 MG tablet, Take 2 tablets (200 mg total) by mouth daily., Disp: 180 tablet, Rfl: 3  Observations/Objective: Patient is well-developed, well-nourished in no acute distress.  Resting comfortably  at home.  Head is normocephalic, atraumatic.  No labored breathing.  Speech is clear and coherent with logical content.  Patient is alert and oriented at baseline.    Assessment and Plan: 1. Dental abscess (Primary) - penicillin v potassium (VEETID) 500 MG tablet; Take 1 tablet (500 mg total) by mouth 3 (three) times daily for 7 days.  Dispense: 21 tablet; Refill: 0 - ibuprofen (ADVIL) 600 MG tablet; Take 1 tablet (600 mg total) by mouth every 8 (eight) hours as needed.  Dispense: 30 tablet; Refill: 0  Trial penicillin, ibuprofen.  Patient education given on supportive care, encouraged to follow-up with dentistry as soon as possible.  Follow Up Instructions: I discussed the assessment and treatment plan with the patient. The patient was provided an opportunity to ask questions and all were answered. The patient agreed with the plan and demonstrated an understanding of the instructions.  A copy of instructions were sent to the patient via MyChart unless otherwise noted below.     The patient was advised to call back or seek an in-person evaluation if the symptoms worsen or if the condition fails to improve as anticipated.    Kasandra Knudsen Mayers, PA-C

## 2023-05-06 ENCOUNTER — Other Ambulatory Visit: Payer: Self-pay | Admitting: Family Medicine

## 2023-05-06 DIAGNOSIS — F1190 Opioid use, unspecified, uncomplicated: Secondary | ICD-10-CM

## 2023-05-11 ENCOUNTER — Other Ambulatory Visit: Payer: Self-pay | Admitting: Family Medicine

## 2023-05-11 DIAGNOSIS — F1190 Opioid use, unspecified, uncomplicated: Secondary | ICD-10-CM

## 2023-05-12 ENCOUNTER — Encounter: Payer: Self-pay | Admitting: Family Medicine

## 2023-05-12 ENCOUNTER — Other Ambulatory Visit: Payer: Self-pay | Admitting: Family Medicine

## 2023-05-12 NOTE — Telephone Encounter (Signed)
Patient still inquiring about her refill

## 2023-05-13 MED ORDER — BUPRENORPHINE HCL-NALOXONE HCL 8-2 MG SL SUBL
1.0000 | SUBLINGUAL_TABLET | Freq: Two times a day (BID) | SUBLINGUAL | 0 refills | Status: DC
Start: 2023-05-13 — End: 2023-06-04

## 2023-05-31 ENCOUNTER — Telehealth: Payer: Self-pay | Admitting: Family Medicine

## 2023-05-31 NOTE — Telephone Encounter (Signed)
 Called patient to see if she can come in on Thursday instead of Friday.

## 2023-06-04 ENCOUNTER — Ambulatory Visit: Payer: MEDICAID | Admitting: Family Medicine

## 2023-06-04 ENCOUNTER — Other Ambulatory Visit: Payer: Self-pay | Admitting: Family Medicine

## 2023-06-04 ENCOUNTER — Other Ambulatory Visit: Payer: Self-pay

## 2023-06-04 ENCOUNTER — Other Ambulatory Visit (HOSPITAL_COMMUNITY)
Admission: RE | Admit: 2023-06-04 | Discharge: 2023-06-04 | Disposition: A | Discharge status: Home / Self Care | Payer: MEDICAID | Source: Ambulatory Visit | Attending: Family Medicine | Admitting: Family Medicine

## 2023-06-04 VITALS — BP 128/74 | HR 86 | Wt 212.2 lb

## 2023-06-04 DIAGNOSIS — Z124 Encounter for screening for malignant neoplasm of cervix: Secondary | ICD-10-CM | POA: Diagnosis present

## 2023-06-04 DIAGNOSIS — Z975 Presence of (intrauterine) contraceptive device: Secondary | ICD-10-CM | POA: Diagnosis not present

## 2023-06-04 DIAGNOSIS — K2100 Gastro-esophageal reflux disease with esophagitis, without bleeding: Secondary | ICD-10-CM | POA: Diagnosis not present

## 2023-06-04 DIAGNOSIS — F1190 Opioid use, unspecified, uncomplicated: Secondary | ICD-10-CM | POA: Diagnosis not present

## 2023-06-04 MED ORDER — BUPRENORPHINE HCL-NALOXONE HCL 8-2 MG SL SUBL: 1.0000 | SUBLINGUAL_TABLET | Freq: Two times a day (BID) | SUBLINGUAL | 0 refills | Status: DC

## 2023-06-04 MED ORDER — PANTOPRAZOLE SODIUM 40 MG PO TBEC: 40.0000 mg | DELAYED_RELEASE_TABLET | Freq: Every day | ORAL | 1 refills | Status: DC

## 2023-06-04 NOTE — Assessment & Plan Note (Signed)
 Stable for a few years now. Referred by family member to psychiatrist, she would like to begin therapy and they would like to assume management of her suboxone rx, I think this is very reasonable and I'm very happy she's going to get more intensive mental health treatment. Refill sent today and UDS collected.

## 2023-06-04 NOTE — Progress Notes (Signed)
 GYNECOLOGY OFFICE VISIT NOTE  History:   Dawn Huff is a 28 y.o. [phone] here today for OUD follow up.  Last seen 11/27/2022, doing well at that time and stable on suboxone 8 BID Also referred to healthy weight clinic as well as psych  Today reports she's been off zoloft for the past two months No longer having nightmares but still having lots of anxiety Wanting to switch to a psychiatrist with intensive OTP They would like to assume rx of suboxone  Due for a pap  Health Maintenance Due  Topic Date Due   Pneumococcal Vaccine 29-32 Years old (1 of 2 - PCV) Never done   INFLUENZA VACCINE  Never done   COVID-19 Vaccine (1 - 2024-25 season) Never done   Cervical Cancer Screening (Pap smear)  07/31/2023    Past Medical History:  Diagnosis Date   ADHD (attention deficit hyperactivity disorder)    Contraceptive management 03/13/2013   IBS (irritable bowel syndrome)    Irregular bleeding 03/10/2013   Lactose intolerance    Low-lying placenta 09/23/2020   1.4 cm from os on initial anatomy scan Resolved 10/18/20    Past Surgical History:  Procedure Laterality Date   CESAREAN SECTION  02/16/2021   Procedure: CESAREAN SECTION;  Surgeon: Reva Bores, MD;  Location: [facility] LD ORS;  Service: Obstetrics;;   NO PAST SURGERIES      The following portions of the patient's history were reviewed and updated as appropriate: allergies, current medications, past family history, past medical history, past social history, past surgical history and problem list.   Health Maintenance:   Last pap: Result Date Procedure Results Follow-ups  07/30/2020 Cytology - PAP( [place]) Neisseria Gonorrhea: Negative Chlamydia: Negative Adequacy: Satisfactory for evaluation; transformation zone component PRESENT. Diagnosis: - Negative for intraepithelial lesion or malignancy (NILM) Microorganisms: Fungal organisms present consistent with Candida spp. Comment: Normal Reference [place] Chlamydia -  Negative Comment: Normal Reference Range Neisseria Gonorrhea - Negative   08/03/2014 Cytology - PAP CYTOLOGY - PAP: PAP RESULT     Last mammogram:  N/a    Review of Systems:  Pertinent items noted in HPI and remainder of comprehensive ROS otherwise negative.  Physical Exam:  BP 128/74   Pulse 86   Wt 212 lb 3.2 oz (96.3 kg)   BMI 41.44 kg/m  CONSTITUTIONAL: Well-developed, well-nourished female in no acute distress.  HEENT:  Normocephalic, atraumatic. External right and left ear normal. No scleral icterus.  NECK: Normal range of motion, supple, no masses noted on observation SKIN: No rash noted. Not diaphoretic. No erythema. No pallor. MUSCULOSKELETAL: Normal range of motion. No edema noted. NEUROLOGIC: Alert and oriented to person, place, and time. Normal muscle tone coordination.  PSYCHIATRIC: Normal mood and affect. Normal behavior. Normal judgment and thought content. RESPIRATORY: Effort normal, no problems with respiration noted ABDOMEN: No masses noted. No other overt distention noted.   PELVIC: Normal appearing external genitalia; normal appearing vaginal mucosa and cervix.  No abnormal discharge noted.  Normal uterine size, no other palpable masses, no uterine or adnexal tenderness.  Labs and Imaging No results found for this or any previous visit (from the past week). No results found.        Assessment and Plan:   Problem List Items Addressed This Visit       Digestive   Gastroesophageal reflux disease with esophagitis without hemorrhage   Reports bad symptoms of reflux, likely related to BMI, discussed one month trial and then stop, if no  improvement needs to see PCP to discuss possible EGD.       Relevant Medications   pantoprazole (PROTONIX) 40 MG tablet     Other   Nexplanon in place   Thinking about switching to IUD but not ready yet, will get in touch if she wants nexplanon removal with IUD insertion.      Opioid use disorder - Primary   Stable for  a few years now. Referred by family member to psychiatrist, she would like to begin therapy and they would like to assume management of her suboxone rx, I think this is very reasonable and I'm very happy she's going to get more intensive mental health treatment. Refill sent today and UDS collected.       Relevant Medications   buprenorphine-naloxone (SUBOXONE) 8-2 mg SUBL SL tablet   Other Relevant Orders   ToxAssure Flex 15, Ur   Other Visit Diagnoses       Encounter for Papanicolaou smear for cervical cancer screening       Relevant Orders   Cytology - PAP( [place])       Routine preventative health maintenance measures emphasized. Please refer to After Visit Summary for other counseling recommendations.   Return for as needed.    Total face-to-face time with patient: 25 minutes.  Over 50% of encounter was spent on counseling and coordination of care.   Venora Maples, MD/MPH Attending Family Medicine Physician, [facility] for [facility], [facility]

## 2023-06-04 NOTE — Assessment & Plan Note (Signed)
 Reports bad symptoms of reflux, likely related to BMI, discussed one month trial and then stop, if no improvement needs to see PCP to discuss possible EGD.

## 2023-06-04 NOTE — Assessment & Plan Note (Signed)
 Thinking about switching to IUD but not ready yet, will get in touch if she wants nexplanon removal with IUD insertion.

## 2023-06-08 LAB — CANNABINOIDS, MS, UR RFX
Cannabinoids Confirmation: POSITIVE
Carboxy-THC: >617 ng/mg{creat}

## 2023-06-08 LAB — TOXASSURE FLEX 15, UR
6-ACETYLMORPHINE IA: NEGATIVE ng/mL
7-aminoclonazepam: NOT DETECTED ng/mg{creat}
AMPHETAMINES IA: NEGATIVE ng/mL
Alpha-hydroxyalprazolam: NOT DETECTED ng/mg{creat}
Alpha-hydroxymidazolam: NOT DETECTED ng/mg{creat}
Alpha-hydroxytriazolam: NOT DETECTED ng/mg{creat}
Alprazolam: NOT DETECTED ng/mg{creat}
BARBITURATES IA: NEGATIVE ng/mL
BUPRENORPHINE: POSITIVE
Benzodiazepines: NEGATIVE
Buprenorphine: 162 ng/mg{creat}
COCAINE METABOLITE IA: NEGATIVE ng/mL
Clonazepam: NOT DETECTED ng/mg{creat}
Creatinine: 162 mg/dL
Desalkylflurazepam: NOT DETECTED ng/mg{creat}
Desmethyldiazepam: NOT DETECTED ng/mg{creat}
Desmethylflunitrazepam: NOT DETECTED ng/mg{creat}
Diazepam: NOT DETECTED ng/mg{creat}
ETHYL ALCOHOL Enzymatic: NEGATIVE g/dL
FENTANYL: NEGATIVE
Fentanyl: NOT DETECTED ng/mg{creat}
Flunitrazepam: NOT DETECTED ng/mg{creat}
Lorazepam: NOT DETECTED ng/mg{creat}
METHADONE IA: NEGATIVE ng/mL
METHADONE MTB IA: NEGATIVE ng/mL
Midazolam: NOT DETECTED ng/mg{creat}
Norbuprenorphine: 452 ng/mg{creat}
Norfentanyl: NOT DETECTED ng/mg{creat}
OPIATE CLASS IA: NEGATIVE ng/mL
OXYCODONE CLASS IA: NEGATIVE ng/mL
Oxazepam: NOT DETECTED ng/mg{creat}
PHENCYCLIDINE IA: NEGATIVE ng/mL
TAPENTADOL, IA: NEGATIVE ng/mL
TRAMADOL IA: NEGATIVE ng/mL
Temazepam: NOT DETECTED ng/mg{creat}

## 2023-06-09 ENCOUNTER — Encounter: Payer: Self-pay | Admitting: Family Medicine

## 2023-06-09 LAB — CYTOLOGY - PAP: Diagnosis: NEGATIVE

## 2023-07-09 ENCOUNTER — Other Ambulatory Visit: Payer: Self-pay | Admitting: Family Medicine

## 2023-07-09 DIAGNOSIS — F1190 Opioid use, unspecified, uncomplicated: Secondary | ICD-10-CM

## 2023-07-10 ENCOUNTER — Other Ambulatory Visit: Payer: Self-pay | Admitting: Family Medicine

## 2023-07-10 DIAGNOSIS — F1190 Opioid use, unspecified, uncomplicated: Secondary | ICD-10-CM

## 2023-07-12 MED ORDER — BUPRENORPHINE HCL-NALOXONE HCL 8-2 MG SL SUBL: 1.0000 | SUBLINGUAL_TABLET | Freq: Two times a day (BID) | SUBLINGUAL | 0 refills | Status: DC

## 2023-08-10 ENCOUNTER — Encounter: Payer: Self-pay | Admitting: Family Medicine

## 2023-08-10 DIAGNOSIS — F1190 Opioid use, unspecified, uncomplicated: Secondary | ICD-10-CM

## 2023-08-11 ENCOUNTER — Other Ambulatory Visit: Payer: Self-pay | Admitting: Family Medicine

## 2023-08-11 DIAGNOSIS — F1190 Opioid use, unspecified, uncomplicated: Secondary | ICD-10-CM

## 2023-08-12 MED ORDER — BUPRENORPHINE HCL-NALOXONE HCL 8-2 MG SL SUBL
1.0000 | SUBLINGUAL_TABLET | Freq: Two times a day (BID) | SUBLINGUAL | 2 refills | Status: DC
Start: 2023-08-12 — End: 2023-11-11

## 2023-08-12 NOTE — Telephone Encounter (Signed)
 Patient had originally been planning to switch to psych in [facility] but intensive OTP did not work for her and no longer going there. Running out of suboxone  which was filled by that provider last time Refill sent Discussed I am happy to continue prescribing for as long as it takes to find someone closer to home Can see her back in summer or early fall for check in and UDS

## 2023-08-31 NOTE — Telephone Encounter (Signed)
 Please disregard this error

## 2023-11-10 ENCOUNTER — Encounter: Payer: Self-pay | Admitting: Family Medicine

## 2023-11-10 ENCOUNTER — Other Ambulatory Visit: Payer: Self-pay | Admitting: Family Medicine

## 2023-11-10 DIAGNOSIS — F1190 Opioid use, unspecified, uncomplicated: Secondary | ICD-10-CM

## 2023-11-11 MED ORDER — BUPRENORPHINE HCL-NALOXONE HCL 8-2 MG SL SUBL: 1.0000 | SUBLINGUAL_TABLET | Freq: Two times a day (BID) | SUBLINGUAL | 2 refills | Status: DC

## 2023-12-07 ENCOUNTER — Telehealth: Payer: MEDICAID | Admitting: Physician Assistant

## 2023-12-07 ENCOUNTER — Encounter: Payer: Self-pay | Admitting: Family Medicine

## 2023-12-07 DIAGNOSIS — B372 Candidiasis of skin and nail: Secondary | ICD-10-CM | POA: Diagnosis not present

## 2023-12-07 DIAGNOSIS — B9689 Other specified bacterial agents as the cause of diseases classified elsewhere: Secondary | ICD-10-CM | POA: Diagnosis not present

## 2023-12-07 DIAGNOSIS — L089 Local infection of the skin and subcutaneous tissue, unspecified: Secondary | ICD-10-CM | POA: Diagnosis not present

## 2023-12-07 MED ORDER — MUPIROCIN 2 % EX OINT: 1.0000 | TOPICAL_OINTMENT | Freq: Two times a day (BID) | CUTANEOUS | 0 refills | Status: AC

## 2023-12-07 MED ORDER — FLUCONAZOLE 150 MG PO TABS: 150.0000 mg | ORAL_TABLET | ORAL | 0 refills | Status: AC

## 2023-12-07 MED ORDER — NYSTATIN 100000 UNIT/GM EX CREA: 1.0000 | TOPICAL_CREAM | Freq: Two times a day (BID) | CUTANEOUS | 0 refills | Status: AC

## 2023-12-07 NOTE — Patient Instructions (Signed)
 Dawn Huff, thank you for joining Dawn Velma Lunger, PA-C for today's virtual visit.  While this provider is not your primary care provider (PCP), if your PCP is located in our provider database this encounter information will be shared with them immediately following your visit.   A [facility] MyChart account gives you access to today's visit and all your visits, tests, and labs performed at [facility]  click here if you don't have a [place] MyChart account or go to [email or web address]  Consent: (Patient) Dawn Huff provided verbal consent for this virtual visit at the beginning of the encounter.  Current Medications:  Current Outpatient Medications:    fluconazole  (DIFLUCAN ) 150 MG tablet, Take 1 tablet (150 mg total) by mouth once a week for 2 doses. Take 1 tablet PO once. Repeat in 3 days if needed., Disp: 2 tablet, Rfl: 0   mupirocin  ointment (BACTROBAN ) 2 %, Apply 1 Application topically 2 (two) times daily., Disp: 22 g, Rfl: 0   nystatin  cream (MYCOSTATIN ), Apply 1 Application topically 2 (two) times daily., Disp: 30 g, Rfl: 0   buprenorphine -naloxone  (SUBOXONE ) 8-2 mg SUBL SL tablet, Place 1 tablet under the tongue in the morning and at bedtime., Disp: 60 tablet, Rfl: 2   pantoprazole  (PROTONIX ) 40 MG tablet, Take 1 tablet (40 mg total) by mouth daily., Disp: 90 tablet, Rfl: 1   Medications ordered in this encounter:  Meds ordered this encounter  Medications   mupirocin  ointment (BACTROBAN ) 2 %    Sig: Apply 1 Application topically 2 (two) times daily.    Dispense:  22 g    Refill:  0    Supervising Provider:   BLAISE ALEENE KIDD [[id number]]   nystatin  cream (MYCOSTATIN )    Sig: Apply 1 Application topically 2 (two) times daily.    Dispense:  30 g    Refill:  0    Supervising Provider:   BLAISE ALEENE KIDD [id number]   fluconazole  (DIFLUCAN ) 150 MG tablet    Sig: Take 1 tablet (150 mg total) by mouth once a week for 2 doses. Take 1 tablet PO once.  Repeat in 3 days if needed.    Dispense:  2 tablet    Refill:  0    Supervising Provider:   LAMPTEY, PHILIP O [[id number]]     *If you need refills on other medications prior to your next appointment, please contact your pharmacy*  Follow-Up: Call back or seek an in-person evaluation if the symptoms worsen or if the condition fails to improve as anticipated.  [facility] [phone]  Other Instructions Needs to wear loose-fitting clothing and allow the skin to breathe as much as possible. Keep area clean and dry.  Start topical Nystatin  to the area, and use of topical Mupirocin  to the area of redness/sore Diflucan  once weekly for 2 weeks. If you note any non-resolving, new, or worsening symptoms despite treatment, please seek an in-person evaluation ASAP.    If you have been instructed to have an in-person evaluation today at a local Urgent Care facility, please use the link below. It will take you to a list of all of our available [facility], including address, phone number and hours of operation. Please do not delay care.  [facility]  If you or a family member do not have a primary care provider, use the link below to schedule a visit and establish care. When you choose a  primary care physician or advanced  practice provider, you gain a long-term partner in health. Find a Primary Care Provider  Learn more about [facility]'s in-office and virtual care options: [facility] - Get Care Now

## 2023-12-07 NOTE — Progress Notes (Signed)
 Virtual Visit Consent   Dawn Huff, you are scheduled for a virtual visit with a [facility] [facility] provider today. Just as with appointments in the office, your consent must be obtained to participate. Your consent will be active for this visit and any virtual visit you may have with one of our providers in the next 365 days. If you have a MyChart account, a copy of this consent can be sent to you electronically.  As this is a virtual visit, video technology does not allow for your provider to perform a traditional examination. This may limit your provider's ability to fully assess your condition. If your provider identifies any concerns that need to be evaluated in person or the need to arrange testing (such as labs, EKG, etc.), we will make arrangements to do so. Although advances in technology are sophisticated, we cannot ensure that it will always work on either your end or our end. If the connection with a video visit is poor, the visit may have to be switched to a telephone visit. With either a video or telephone visit, we are not always able to ensure that we have a secure connection.  By engaging in this virtual visit, you consent to the provision of healthcare and authorize for your insurance to be billed (if applicable) for the services provided during this visit. Depending on your insurance coverage, you may receive a charge related to this service.  I need to obtain your verbal consent now. Are you willing to proceed with your visit today? Dawn Huff has provided verbal consent on 12/07/2023 for a virtual visit (video or telephone). Dawn Huff, [place]  Date: 12/07/2023 2:10 PM   Virtual Visit via Video Note   I, Dawn Huff, connected with  Dawn Huff  ([id number], [birth year]) on 12/07/23 at  2:00 PM EDT by a video-enabled telemedicine application and verified that I am speaking with the correct person using two identifiers.  Location: Patient: Virtual Visit Location Patient:  Home Provider: Virtual Visit Location Provider: Home Office   I discussed the limitations of evaluation and management by telemedicine and the availability of in person appointments. The patient expressed understanding and agreed to proceed.    History of Present Illness: Dawn Huff is a 28 y.o. who identifies as a female who was assigned female at birth, and is being seen today for rash first noted about a few weeks ago. Rash is underneath the breast area although she notes sometimes she will get similar rash at her c-section site or in the inguinal region. Notes she has scratched the area a bit to where it is somewhat painful at times. Has been applying powder to the area. Denies fever, chills.   HPI: HPI  Problems:  Patient Active Problem List   Diagnosis Date Noted   Gastroesophageal reflux disease with esophagitis without hemorrhage 06/04/2023   Morbid obesity (HCC) 11/27/2022   Other fatigue 02/17/2022   Drug-induced constipation 10/30/2021   Anxiety and depression 06/03/2021   Syncope 11/26/2020   Rubella non-immune status, antepartum 08/13/2020   Opioid use disorder 07/30/2020   Nexplanon  in place 03/13/2013    Allergies:  Allergies  Allergen Reactions   Lactose Intolerance (Gi)    Medications:  Current Outpatient Medications:    fluconazole  (DIFLUCAN ) 150 MG tablet, Take 1 tablet (150 mg total) by mouth once a week for 2 doses. Take 1 tablet PO once. Repeat in 3 days if needed., Disp: 2 tablet, Rfl: 0   mupirocin  ointment (BACTROBAN ) 2 %,  Apply 1 Application topically 2 (two) times daily., Disp: 22 g, Rfl: 0   nystatin  cream (MYCOSTATIN ), Apply 1 Application topically 2 (two) times daily., Disp: 30 g, Rfl: 0   buprenorphine -naloxone  (SUBOXONE ) 8-2 mg SUBL SL tablet, Place 1 tablet under the tongue in the morning and at bedtime., Disp: 60 tablet, Rfl: 2   pantoprazole  (PROTONIX ) 40 MG tablet, Take 1 tablet (40 mg total) by mouth daily., Disp: 90 tablet, Rfl:  1  Observations/Objective: Patient is well-developed, well-nourished in no acute distress.  Resting comfortably  at home.  Head is normocephalic, atraumatic.  No labored breathing.  Speech is clear and coherent with logical content.  Patient is alert and oriented at baseline.  Patchy erythematous rash noted underneath the breast with shining and satellite lesions noted. There is an area of rash under R breast, most inferiorly with superficial ulceration of the skin and signs of excoriation.  Assessment and Plan: 1. Yeast infection of the skin (Primary) - nystatin  cream (MYCOSTATIN ); Apply 1 Application topically 2 (two) times daily.  Dispense: 30 g; Refill: 0 - fluconazole  (DIFLUCAN ) 150 MG tablet; Take 1 tablet (150 mg total) by mouth once a week for 2 doses. Take 1 tablet PO once. Repeat in 3 days if needed.  Dispense: 2 tablet; Refill: 0  2. Bacterial skin infection - mupirocin  ointment (BACTROBAN ) 2 %; Apply 1 Application topically 2 (two) times daily.  Dispense: 22 g; Refill: 0  Supportive measures and OTC medications reviewed. Needs to wear loose-fitting clothing and allow the skin to breathe as much as possible. Keep area clean and dry.  Will start topical Nystatin  to the area, and use of topical Mupirocin  to the area of excoriation. Diflucan  once weekly for 2 weeks. Follow-up in person for any non-resolving, new or worsening symptoms despite treatment.   Follow Up Instructions: I discussed the assessment and treatment plan with the patient. The patient was provided an opportunity to ask questions and all were answered. The patient agreed with the plan and demonstrated an understanding of the instructions.  A copy of instructions were sent to the patient via MyChart unless otherwise noted below.   The patient was advised to call back or seek an in-person evaluation if the symptoms worsen or if the condition fails to improve as anticipated.    Dawn Velma Lunger, PA-C

## 2023-12-11 ENCOUNTER — Other Ambulatory Visit: Payer: Self-pay | Admitting: Family Medicine

## 2023-12-11 DIAGNOSIS — K2100 Gastro-esophageal reflux disease with esophagitis, without bleeding: Secondary | ICD-10-CM

## 2023-12-28 ENCOUNTER — Encounter: Payer: Self-pay | Admitting: Family Medicine

## 2023-12-30 ENCOUNTER — Encounter: Payer: Self-pay | Admitting: Family Medicine

## 2023-12-31 ENCOUNTER — Other Ambulatory Visit: Payer: Self-pay

## 2023-12-31 DIAGNOSIS — K2100 Gastro-esophageal reflux disease with esophagitis, without bleeding: Secondary | ICD-10-CM

## 2023-12-31 MED ORDER — PANTOPRAZOLE SODIUM 40 MG PO TBEC
40.0000 mg | DELAYED_RELEASE_TABLET | Freq: Every day | ORAL | 2 refills | Status: AC
Start: 2023-12-31 — End: ?

## 2024-01-18 ENCOUNTER — Encounter: Payer: Self-pay | Admitting: Family Medicine

## 2024-01-20 ENCOUNTER — Encounter: Payer: MEDICAID | Admitting: Family Medicine

## 2024-02-10 ENCOUNTER — Ambulatory Visit (INDEPENDENT_AMBULATORY_CARE_PROVIDER_SITE_OTHER): Payer: MEDICAID | Admitting: Family Medicine

## 2024-02-10 ENCOUNTER — Other Ambulatory Visit: Payer: Self-pay

## 2024-02-10 ENCOUNTER — Encounter: Payer: Self-pay | Admitting: Family Medicine

## 2024-02-10 ENCOUNTER — Telehealth: Payer: Self-pay | Admitting: Clinical

## 2024-02-10 VITALS — BP 123/75 | HR 66 | Wt 210.2 lb

## 2024-02-10 DIAGNOSIS — F419 Anxiety disorder, unspecified: Secondary | ICD-10-CM | POA: Diagnosis not present

## 2024-02-10 DIAGNOSIS — F32A Depression, unspecified: Secondary | ICD-10-CM | POA: Diagnosis not present

## 2024-02-10 DIAGNOSIS — F1190 Opioid use, unspecified, uncomplicated: Secondary | ICD-10-CM

## 2024-02-10 MED ORDER — BUPRENORPHINE HCL-NALOXONE HCL 8-2 MG SL SUBL: 1.0000 | SUBLINGUAL_TABLET | Freq: Two times a day (BID) | SUBLINGUAL | 2 refills | Status: AC

## 2024-02-10 NOTE — Progress Notes (Signed)
 Dawn Huff is a 28 y.o. [phone] here today for OUD f/u.  Has been feeling super anxious I recall that in the past she was on zoloft  200 and abilify  5 and was doing pretty well at that time She reports that she has been off for about a year Its very challenging to be a single mom Father is telling people he's not hers, has not seen her since the day she was born and called CPS on Dawn Huff when she was 33 weeks old But Dawn Huff is starting to ask questions about her father and it is hard to have those conversations  When her mental health is better she would like to taper down or come off her suboxone  Sometimes feels like her dose may be insufficient Taking it BID at present  Health Maintenance Due  Topic Date Due   Pneumococcal Vaccine (1 of 2 - PCV) Never done   Hepatitis B Vaccines 19-59 Average Risk (1 of 3 - 19+ 3-dose series) Never done   HPV VACCINES (1 - 3-dose SCDM series) Never done   Influenza Vaccine  Never done   COVID-19 Vaccine (1 - 2025-26 season) Never done    Past Medical History:  Diagnosis Date   ADHD (attention deficit hyperactivity disorder)    Contraceptive management 03/13/2013   IBS (irritable bowel syndrome)    Irregular bleeding 03/10/2013   Lactose intolerance    Low-lying placenta 09/23/2020   1.4 cm from os on initial anatomy scan Resolved 10/18/20    Past Surgical History:  Procedure Laterality Date   CESAREAN SECTION  02/16/2021   Procedure: CESAREAN SECTION;  Surgeon: Fredirick Glenys RAMAN, MD;  Location: [facility] LD ORS;  Service: Obstetrics;;   NO PAST SURGERIES      The following portions of the patient's history were reviewed and updated as appropriate: allergies, current medications, past family history, past medical history, past social history, past surgical history and problem list.   Health Maintenance:   Last pap:  Result Date Procedure Results Follow-ups  06/04/2023 Cytology - PAP( [place]) Adequacy: Satisfactory for evaluation; transformation  zone component PRESENT. Diagnosis: - Negative for intraepithelial lesion or malignancy (NILM)   07/30/2020 Cytology - PAP( [place]) Neisseria Gonorrhea: Negative Chlamydia: Negative Adequacy: Satisfactory for evaluation; transformation zone component PRESENT. Diagnosis: - Negative for intraepithelial lesion or malignancy (NILM) Microorganisms: Fungal organisms present consistent with Candida spp. Comment: Normal Reference [place] Chlamydia - Negative Comment: Normal Reference Range Neisseria Gonorrhea - Negative   08/03/2014 Cytology - PAP CYTOLOGY - PAP: PAP RESULT     Last mammogram:  N/a    Last LFTs: Lab Results  Component Value Date   ALT 15 02/13/2021   AST 15 02/13/2021   ALKPHOS 157 (H) 02/13/2021   BILITOT 0.3 02/13/2021     Review of Systems:  Pertinent items noted in HPI and remainder of comprehensive ROS otherwise negative.  Physical Exam:  BP 123/75   Pulse 66   Wt 210 lb 4 oz (95.4 kg)   BMI 41.06 kg/m  CONSTITUTIONAL: Well-developed, well-nourished female in no acute distress.  HEENT:  Normocephalic, atraumatic. External right and left ear normal. No scleral icterus.  NECK: Normal range of motion, supple, no masses noted on observation SKIN: No rash noted. Not diaphoretic. No erythema. No pallor. MUSCULOSKELETAL: Normal range of motion. No edema noted. NEUROLOGIC: Alert and oriented to person, place, and time. Normal muscle tone coordination.  PSYCHIATRIC: anxious and depressed mood/affect RESPIRATORY: Effort normal, no problems with respiration  noted   Labs and Imaging I have reviewed the PDMP during this encounter.    Last UDS: Lab Results  Component Value Date   CREATIUR 162 06/04/2023     No results found for this or any previous visit (from the past week). No results found.      Assessment and Plan:   Problem List Items Addressed This Visit       Other   Anxiety and depression   Longstanding and ongoing struggle. Referral placed  for IBH through our clinic but also accepts referral back to psychiatry. Deferred initiation of meds with me today pending psychiatry consultation.       Relevant Orders   Ambulatory referral to Integrated Behavioral Health   Ambulatory referral to Psychiatry   Opioid use disorder - Primary   Stable UDS today Way overdue for screening CMP, obtained today Discussed she has been in a good place for years with her OUD and I am open to discussion of weaning or tapering off completely but we should get her mental health in a good place first, she is in agreement. Discussed for right now if she's having periods with possible withdrawal she could try doing TID dosing (no change in TDD) instead of BID to see if this helps      Relevant Medications   buprenorphine -naloxone  (SUBOXONE ) 8-2 mg SUBL SL tablet   Other Relevant Orders   ToxAssure Flex 15, Ur   Comp Met (CMET)      Return in about 6 months (around 08/09/2024) for OUD f/u.    Total face-to-face time with patient: 30 minutes.  Over 50% of encounter was spent on counseling and coordination of care.  No future appointments.  Dawn CHRISTELLA Carolus, MD/MPH Attending Family Medicine Physician, [facility] for [facility], [facility]

## 2024-02-10 NOTE — Assessment & Plan Note (Signed)
 Longstanding and ongoing struggle. Referral placed for IBH through our clinic but also accepts referral back to psychiatry. Deferred initiation of meds with me today pending psychiatry consultation.

## 2024-02-10 NOTE — Assessment & Plan Note (Addendum)
 Stable UDS today Way overdue for screening CMP, obtained today Discussed she has been in a good place for years with her OUD and I am open to discussion of weaning or tapering off completely but we should get her mental health in a good place first, she is in agreement. Discussed for right now if she's having periods with possible withdrawal she could try doing TID dosing (no change in TDD) instead of BID to see if this helps

## 2024-02-10 NOTE — Telephone Encounter (Signed)
Attempt call regarding referral; Left HIPPA-compliant message to call back Mikle Sternberg from [facility] at  [phone] (Ashelyn Mccravy's office).    

## 2024-02-11 LAB — COMPREHENSIVE METABOLIC PANEL WITH GFR
ALT: 18 IU/L (ref 0–32)
AST: 17 IU/L (ref 0–40)
Albumin: 4.1 g/dL (ref 4.0–5.0)
Alkaline Phosphatase: 65 IU/L (ref 41–116)
BUN/Creatinine Ratio: 19 (ref 9–23)
BUN: 13 mg/dL (ref 6–20)
Bilirubin Total: 0.2 mg/dL (ref 0.0–1.2)
CO2: 23 mmol/L (ref 20–29)
Calcium: 9 mg/dL (ref 8.7–10.2)
Chloride: 106 mmol/L (ref 96–106)
Creatinine, Ser: 0.69 mg/dL (ref 0.57–1.00)
Globulin, Total: 1.9 g/dL (ref 1.5–4.5)
Glucose: 81 mg/dL (ref 70–99)
Potassium: 4 mmol/L (ref 3.5–5.2)
Sodium: 141 mmol/L (ref 134–144)
Total Protein: 6 g/dL (ref 6.0–8.5)
eGFR: 121 mL/min/1.73 (ref >59)

## 2024-02-12 ENCOUNTER — Ambulatory Visit: Payer: Self-pay | Admitting: Family Medicine

## 2024-02-14 LAB — TOXASSURE FLEX 15, UR
6-ACETYLMORPHINE IA: NEGATIVE ng/mL
7-aminoclonazepam: NOT DETECTED ng/mg{creat}
AMPHETAMINES IA: NEGATIVE ng/mL
Alpha-hydroxyalprazolam: NOT DETECTED ng/mg{creat}
Alpha-hydroxymidazolam: NOT DETECTED ng/mg{creat}
Alpha-hydroxytriazolam: NOT DETECTED ng/mg{creat}
Alprazolam: NOT DETECTED ng/mg{creat}
BARBITURATES IA: NEGATIVE ng/mL
BUPRENORPHINE: POSITIVE
Benzodiazepines: NEGATIVE
Buprenorphine: 191 ng/mg{creat}
COCAINE METABOLITE IA: NEGATIVE ng/mL
Clonazepam: NOT DETECTED ng/mg{creat}
Creatinine: 210 mg/dL (ref >=20)
Desalkylflurazepam: NOT DETECTED ng/mg{creat}
Desmethyldiazepam: NOT DETECTED ng/mg{creat}
Desmethylflunitrazepam: NOT DETECTED ng/mg{creat}
Diazepam: NOT DETECTED ng/mg{creat}
ETHYL ALCOHOL Enzymatic: NEGATIVE g/dL
FENTANYL: NEGATIVE
Fentanyl: NOT DETECTED ng/mg{creat}
Flunitrazepam: NOT DETECTED ng/mg{creat}
Lorazepam: NOT DETECTED ng/mg{creat}
METHADONE IA: NEGATIVE ng/mL
METHADONE MTB IA: NEGATIVE ng/mL
Midazolam: NOT DETECTED ng/mg{creat}
Norbuprenorphine: >476 ng/mg{creat}
Norfentanyl: NOT DETECTED ng/mg{creat}
OPIATE CLASS IA: NEGATIVE ng/mL
OXYCODONE CLASS IA: NEGATIVE ng/mL
Oxazepam: NOT DETECTED ng/mg{creat}
PHENCYCLIDINE IA: NEGATIVE ng/mL
TAPENTADOL, IA: NEGATIVE ng/mL
TRAMADOL IA: NEGATIVE ng/mL
Temazepam: NOT DETECTED ng/mg{creat}

## 2024-02-14 LAB — CANNABINOIDS, MS, UR RFX
Cannabinoids Confirmation: POSITIVE
Carboxy-THC: >476 ng/mg{creat}

## 2024-03-03 ENCOUNTER — Telehealth: Payer: Self-pay | Admitting: Clinical

## 2024-03-03 NOTE — Telephone Encounter (Signed)
Attempt call regarding referral; Left HIPPA-compliant message to call back Mikle Sternberg from [facility] at  [phone] (Ashelyn Mccravy's office).    

## 2024-03-06 NOTE — ED Triage Notes (Signed)
 Patient reports she slipped in the snow and injured her right lower leg. Swelling noted to right ankle and lower tib fib. No other injuries

## 2024-03-07 ENCOUNTER — Encounter: Payer: Self-pay | Admitting: Family Medicine

## 2024-03-08 NOTE — Progress Notes (Addendum)
 "  Name: Dawn Huff Date: 03/08/2024 MRN: [id number] DOB: September 14, 1995 PCP: Lola Donnice HERO, MD  Reason for visit:  Chief Complaint  Patient presents with   Right Ankle - Pain, Fracture   Subjective:   Dawn Huff is a very pleasant 28 year old female who presents today for acute right ankle pain.  Fortunately on 03/06/2024 2 days ago patient slipped in the snow while walking to the mailbox.  X-rays at the emergency department were positive for a distal fibular spiral fracture, no obvious fracture of the proximal fibula.  There is no obvious fracture to the medial ankle.  Weber type B-C.  Patient states she has been placing minimal weight on her leg, she walk a couple steps and then place her right ankle down.  She presents today in a wheelchair.  She presents with a splint.  Slight paresthesias to the toes.  Her pain is localized over the anterior tibiotalar joint but also over the lateral aspect.  She does have some medial sided pain though not as severe.  Patient does take Suboxone .  03/06/2024 ED note: HPI Dawn Huff is a 28 y.o. female  who presents today to the  emergency department complaining of symptoms that started prior to arrival.  She reports she was walking to the mailbox and slipped in the snow, she twisted the right ankle and heard a pop.  Since then she has had painful weight bearing, and she has swelling of the lateral right ankle as well.  She denies any numbness or tingling. She does not appear to be in distress during this exam.   Past Medical History[1]  Past Surgical History[2]  Current Medications[3]  Allergies[4]  Family History[5]  Social History   Occupational History   Not on file  Tobacco Use   Smoking status: Not on file   Smokeless tobacco: Not on file  Substance and Sexual Activity   Alcohol use: Not on file   Drug use: Not on file   Sexual activity: Not on file    Constitutional: Denies chills, fevers, sweats,  unusual or increased fatigue, weight changes. Neurological: Denies upper or lower extremity paresthesia or vertigo. Cardiovascular: Denies chest pain, dyspnea on exertion, edema. Respiratory: Denies cough, shortness of breath, wheezing. GI: Denies changes in appetite, abdominal pain or discomfort. GU: Denies any change in bowel habit. MSK: As per HPI.  Skin: Denies rash, lesions. Hematological: Denies bruising, swollen lymph nodes. Endocrine: Denies lethargy, skin changes.  Physical exam: BP 112/71 (BP Position: Sitting)   Pulse 81 :   GEN: 28 yo female in NAD; Sitting comfortably in office, pleasant. HEAD: Normocephalic, atraumatic. EYES: Sclera non-icteric EOMI. ENT: Ears externally normal. No nasal congestion.  LUNGS: Normal respiratory effort. No retractions or cough noted. NEURO: CN II-XII grossly intact; MAEE. No focal neurologic deficits noted. PSYCH: Appropriate affect. Alert and oriented. DERM: Warm, dry. Appropriate turgor. No rashes or lesions noted. MSK: Right lower extremity without gross deformity though there is at least moderate edema about the foot and to the distal ankle.  No evidence of compartment syndrome.  Soft, compressible compartments.  There is tenderness of the distal fibula over the known fracture site, tenderness over the anterior tibiotalar joint as well as over the medial malleolus.  Tenderness of the deltoid ligament.  There is tenderness of the base of the fifth metatarsal.  Patient able to gently move her toes.  Full range of motion strength exam deferred due to acute fracture.  No severe tenderness to  the knee or proximal fibula.  Positive Syndesmotic squeeze for tibiotalar joint pain today.  There is guarding present.  2+ pedal pulse.  Sensation is intact to blunt touch.  Less than 3-second capillary refill to perfusion distally to all 5 toes.  Medical Decision Making: Comparison to prior: X-ray of the right ankle including stress view today shows  previously demonstrated healing spiral type fracture of the distal fibular shaft, no gross displacement from prior plain film.  There is no obvious fracture of the medial malleolus or distal tibia.  There is no gross disruption of the syndesmosis.  There is no medial clear space widening on stress view today.  X-ray of the right foot today shows no obvious acute fracture including to the base of the fifth metatarsal, no gross arthropathy.  Narrative  Exam:  Right Tibia and Fibula and Right Ankle     History:  Right ankle pain and swelling after a fall.     Technique:  2 views of the right tibia and fibula and 3 views of the right ankle.     Comparison:  None.     Findings:    A slightly displaced spiral fracture of the distal fibular metadiaphyseal region is seen. No other fracture is identified.  Joint spaces are preserved.  Bone density appears normal.  There is moderate soft tissue swelling about the lateral malleolus.   Impression  Slightly displaced spiral fracture of the distal fibular metadiaphyseal region.   1. Closed fracture of shaft of right fibula, unspecified fracture morphology, initial encounter   2. Acute right ankle pain   3. Right foot pain    Plan: Orders Placed This Encounter  Procedures   Home Medical Equipment    Knee scooter    Length of Need::   3 months    Specify::   knee scooter   XR Ankle 3 or More Views Right    Reason for Exam::   pain    Is the patient pregnant?:   No    Performed at:   In Clinic   XR Foot 3 Or More Views Right    Performed at:   In Clinic    Reason for Exam::   pain    Is the patient pregnant?:   No   I had a good, long conversation today with the patient.  We did discuss the x-ray and exam in detail.  We did discuss the nature of the condition and multiple treatment options including ice, heat, topical modalities such as lidocaine  patches, Voltaren gel, or Biofreeze or Aspercreme with lidocaine , bracing, physical therapy,  activity modification, nerve block, p.o. medication including anti-inflammatories such as Celebrex, Mobic, Advil , or ibuprofen  or naproxen, Tylenol  including Tylenol  arthritis, or Medrol Dosepak, or muscle relaxant, corticosteroid injection, viscosupplementation injection, advanced imaging in the form of MRI, or surgical intervention.  Consultation with orthopedic surgeon Dr. Heyward who has seen imaging and agrees with the following plan.  We did discuss her x-ray and exam in detail.  Overall patient has a spiral type fracture of the distal fibular shaft.  There is no obvious fracture or lucency to the medial malleolus or to the distal tibia.  We did discuss the risk of radio occult pathology.  At this time she has a stable stress view.  There is no obvious fracture to the foot including base of the fifth metatarsal.  I did discuss the risk of displacement.  We also did discuss surgical indications.  We did discuss appropriate  immobilization.  Per Dr. Heyward and myself no cast, patient is too swollen today to also consider a cast and would be at risk of compartment syndrome.  At this time patient was transitioned to a formfitting tall boot, patient stated the boot fit her well in clinic.  I recommend she wear this with weightbearing, recommend toe-touch weightbearing at this time with crutches or knee scooter.  If it hurts too much she is to remain nonweightbearing until her symptoms including pain improved.  We did discuss the use of crutches and knee scooter today.  We discussed what she should and should not do.  I recommend she remove the boot when nonweightbearing only at least 3 times a day to elevate the foot and ankle above the level of the heart to help with swelling, she is also advised gentle range of motion through dorsiflexion plantarflexion to prevent stiffness.  Patient is on Suboxone , she was prescribed Flexeril  5 mg.  Patient has tolerated this medication in the past.  We did discuss the risk of  this medication.  She also was prescribed ibuprofen  800 mg to take with food and water.  We discussed indications for CT or MRI.  No indication today for advanced imaging, stable fracture but we will continue to closely monitor. We also did discuss the risk of chronic pain, stiffness, instability, disruption of proprioception, soft tissue injury with her injury and rehab.  Recommend she not do too much on her ankle as this could cause further displacement or delay healing.  Stressed the importance of not doing too much on her foot and ankle due to risk of displacement.  Again we have discussed surgical indications.  *We did discuss if she has any trouble with the boot she is to call this office to be seen sooner.  We did discuss return precautions as well.  Follow-up in 1.5-2 weeks, repeat x-ray and exam.  X-ray of ankle only.  They are encouraged to follow-up sooner for any increased concerns, problems, questions, or complaints.  Patient verbalized understanding and agreed with this plan today.  Return in about 12 days (around 03/20/2024).  Heinz CANDIE Jacobs, DNP, APRN, FNP-C, ONP-C       [1] No past medical history on file. [2] No past surgical history on file. [3] Current Outpatient Medications  Medication Sig Dispense Refill   buprenorphine -naloxone  (SUBOXONE ) 8-2 mg sublingual tablet Place 1 tablet (8 mg of buprenorphine  total) under the tongue.     pantoprazole  (PROTONIX ) 40 MG tablet Take 1 tablet (40 mg total) by mouth daily. TAKE 1 TABLET (40 MG TOTAL) BY MOUTH DAILY.     cyclobenzaprine  (FLEXERIL ) 5 MG tablet Take 1 tablet (5 mg total) by mouth two (2) times a day as needed for muscle spasms for up to 20 days. May make drowsy. 40 tablet 0   ibuprofen  (MOTRIN ) 800 MG tablet Take 1 tablet (800 mg total) by mouth two (2) times a day as needed for pain for up to 20 days. 40 tablet 0   No current facility-administered medications for this visit.  [4] Allergies Allergen  Reactions   Lactose   [5] History reviewed. No pertinent family history. "

## 2024-03-10 ENCOUNTER — Telehealth: Payer: Self-pay | Admitting: Clinical

## 2024-03-10 NOTE — Telephone Encounter (Signed)
Attempt call regarding referral; Left HIPPA-compliant message to call back Mikle Sternberg from [facility] at  [phone] (Ashelyn Mccravy's office).    

## 2024-09-25 ENCOUNTER — Ambulatory Visit (HOSPITAL_COMMUNITY): Payer: MEDICAID | Admitting: Registered Nurse
